# Patient Record
Sex: Female | Born: 1955 | Race: White | Hispanic: No | Marital: Married | Smoking: Never smoker
Health system: Southern US, Community
[De-identification: ages and names within clinical notes are randomized; demographics above are authoritative.]

## PROBLEM LIST (undated history)

## (undated) DIAGNOSIS — R011 Cardiac murmur, unspecified: Secondary | ICD-10-CM

## (undated) DIAGNOSIS — N751 Abscess of Bartholin's gland: Secondary | ICD-10-CM

## (undated) DIAGNOSIS — B029 Zoster without complications: Secondary | ICD-10-CM

## (undated) DIAGNOSIS — R002 Palpitations: Secondary | ICD-10-CM

## (undated) DIAGNOSIS — I493 Ventricular premature depolarization: Secondary | ICD-10-CM

## (undated) DIAGNOSIS — I341 Nonrheumatic mitral (valve) prolapse: Secondary | ICD-10-CM

## (undated) DIAGNOSIS — E785 Hyperlipidemia, unspecified: Secondary | ICD-10-CM

## (undated) DIAGNOSIS — IMO0002 Reserved for concepts with insufficient information to code with codable children: Secondary | ICD-10-CM

## (undated) HISTORY — DX: Palpitations: R00.2

## (undated) HISTORY — PX: TONSILLECTOMY AND ADENOIDECTOMY: SUR1326

## (undated) HISTORY — PX: KNEE ARTHROSCOPY: SUR90

## (undated) HISTORY — DX: Abscess of Bartholin's gland: N75.1

## (undated) HISTORY — PX: CERVICAL BIOPSY  W/ LOOP ELECTRODE EXCISION: SUR135

## (undated) HISTORY — DX: Reserved for concepts with insufficient information to code with codable children: IMO0002

## (undated) HISTORY — DX: Zoster without complications: B02.9

## (undated) HISTORY — DX: Cardiac murmur, unspecified: R01.1

## (undated) HISTORY — PX: COLPOSCOPY: SHX161

## (undated) HISTORY — DX: Hyperlipidemia, unspecified: E78.5

## (undated) HISTORY — PX: APPENDECTOMY: SHX54

## (undated) HISTORY — PX: AUGMENTATION MAMMAPLASTY: SUR837

## (undated) HISTORY — DX: Ventricular premature depolarization: I49.3

## (undated) HISTORY — DX: Nonrheumatic mitral (valve) prolapse: I34.1

---

## 1988-01-14 HISTORY — PX: AUGMENTATION MAMMAPLASTY: SUR837

## 1998-08-03 ENCOUNTER — Other Ambulatory Visit: Admission: RE | Admit: 1998-08-03 | Discharge: 1998-08-03 | Payer: Self-pay | Admitting: Obstetrics and Gynecology

## 1999-06-28 ENCOUNTER — Encounter: Admission: RE | Admit: 1999-06-28 | Discharge: 1999-06-28 | Payer: Self-pay | Admitting: Diagnostic Radiology

## 1999-06-28 ENCOUNTER — Encounter: Payer: Self-pay | Admitting: Diagnostic Radiology

## 1999-08-04 ENCOUNTER — Other Ambulatory Visit: Admission: RE | Admit: 1999-08-04 | Discharge: 1999-08-04 | Payer: Self-pay | Admitting: Obstetrics and Gynecology

## 2000-06-27 ENCOUNTER — Encounter: Payer: Self-pay | Admitting: Obstetrics and Gynecology

## 2000-06-27 ENCOUNTER — Encounter: Admission: RE | Admit: 2000-06-27 | Discharge: 2000-06-27 | Payer: Self-pay | Admitting: Obstetrics and Gynecology

## 2000-08-06 ENCOUNTER — Other Ambulatory Visit: Admission: RE | Admit: 2000-08-06 | Discharge: 2000-08-06 | Payer: Self-pay | Admitting: Obstetrics and Gynecology

## 2001-07-05 ENCOUNTER — Encounter: Payer: Self-pay | Admitting: Obstetrics and Gynecology

## 2001-07-05 ENCOUNTER — Encounter: Admission: RE | Admit: 2001-07-05 | Discharge: 2001-07-05 | Payer: Self-pay | Admitting: Obstetrics and Gynecology

## 2001-12-13 ENCOUNTER — Other Ambulatory Visit: Admission: RE | Admit: 2001-12-13 | Discharge: 2001-12-13 | Payer: Self-pay | Admitting: Obstetrics and Gynecology

## 2002-03-14 ENCOUNTER — Encounter: Payer: Self-pay | Admitting: *Deleted

## 2002-03-14 ENCOUNTER — Ambulatory Visit (HOSPITAL_COMMUNITY): Admission: RE | Admit: 2002-03-14 | Discharge: 2002-03-14 | Payer: Self-pay | Admitting: *Deleted

## 2002-07-07 ENCOUNTER — Encounter: Admission: RE | Admit: 2002-07-07 | Discharge: 2002-07-07 | Payer: Self-pay | Admitting: Obstetrics and Gynecology

## 2002-07-07 ENCOUNTER — Encounter: Payer: Self-pay | Admitting: Obstetrics and Gynecology

## 2003-02-26 ENCOUNTER — Encounter: Admission: RE | Admit: 2003-02-26 | Discharge: 2003-02-26 | Payer: Self-pay | Admitting: Diagnostic Radiology

## 2003-07-10 ENCOUNTER — Encounter: Admission: RE | Admit: 2003-07-10 | Discharge: 2003-07-10 | Payer: Self-pay | Admitting: Obstetrics and Gynecology

## 2003-07-29 ENCOUNTER — Other Ambulatory Visit: Admission: RE | Admit: 2003-07-29 | Discharge: 2003-07-29 | Payer: Self-pay | Admitting: Obstetrics and Gynecology

## 2003-08-17 ENCOUNTER — Ambulatory Visit (HOSPITAL_COMMUNITY): Admission: RE | Admit: 2003-08-17 | Discharge: 2003-08-17 | Payer: Self-pay | Admitting: Diagnostic Radiology

## 2003-08-20 ENCOUNTER — Encounter: Admission: RE | Admit: 2003-08-20 | Discharge: 2003-08-20 | Payer: Self-pay | Admitting: Neurosurgery

## 2004-07-19 ENCOUNTER — Encounter: Admission: RE | Admit: 2004-07-19 | Discharge: 2004-07-19 | Payer: Self-pay | Admitting: Obstetrics and Gynecology

## 2004-08-02 ENCOUNTER — Other Ambulatory Visit: Admission: RE | Admit: 2004-08-02 | Discharge: 2004-08-02 | Payer: Self-pay | Admitting: Obstetrics and Gynecology

## 2005-02-01 ENCOUNTER — Other Ambulatory Visit: Admission: RE | Admit: 2005-02-01 | Discharge: 2005-02-01 | Payer: Self-pay | Admitting: Obstetrics and Gynecology

## 2005-02-17 ENCOUNTER — Encounter: Admission: RE | Admit: 2005-02-17 | Discharge: 2005-02-17 | Payer: Self-pay | Admitting: Obstetrics and Gynecology

## 2005-05-18 ENCOUNTER — Other Ambulatory Visit: Admission: RE | Admit: 2005-05-18 | Discharge: 2005-05-18 | Payer: Self-pay | Admitting: Obstetrics and Gynecology

## 2005-07-31 ENCOUNTER — Encounter: Admission: RE | Admit: 2005-07-31 | Discharge: 2005-07-31 | Payer: Self-pay | Admitting: Obstetrics and Gynecology

## 2005-08-03 ENCOUNTER — Other Ambulatory Visit: Admission: RE | Admit: 2005-08-03 | Discharge: 2005-08-03 | Payer: Self-pay | Admitting: Obstetrics and Gynecology

## 2006-05-29 ENCOUNTER — Encounter: Admission: RE | Admit: 2006-05-29 | Discharge: 2006-05-29 | Payer: Self-pay | Admitting: Diagnostic Radiology

## 2006-08-07 ENCOUNTER — Other Ambulatory Visit: Admission: RE | Admit: 2006-08-07 | Discharge: 2006-08-07 | Payer: Self-pay | Admitting: Obstetrics and Gynecology

## 2006-08-27 ENCOUNTER — Encounter: Admission: RE | Admit: 2006-08-27 | Discharge: 2006-08-27 | Payer: Self-pay | Admitting: Obstetrics and Gynecology

## 2006-09-03 ENCOUNTER — Encounter: Admission: RE | Admit: 2006-09-03 | Discharge: 2006-09-03 | Payer: Self-pay | Admitting: Diagnostic Radiology

## 2006-09-03 HISTORY — PX: US ECHOCARDIOGRAPHY: HXRAD669

## 2006-10-15 ENCOUNTER — Emergency Department (HOSPITAL_COMMUNITY): Admission: EM | Admit: 2006-10-15 | Discharge: 2006-10-15 | Payer: Self-pay | Admitting: Emergency Medicine

## 2007-08-08 ENCOUNTER — Other Ambulatory Visit: Admission: RE | Admit: 2007-08-08 | Discharge: 2007-08-08 | Payer: Self-pay | Admitting: Obstetrics and Gynecology

## 2007-09-03 ENCOUNTER — Encounter: Admission: RE | Admit: 2007-09-03 | Discharge: 2007-09-03 | Payer: Self-pay | Admitting: Obstetrics and Gynecology

## 2008-02-20 ENCOUNTER — Encounter: Admission: RE | Admit: 2008-02-20 | Discharge: 2008-02-20 | Payer: Self-pay | Admitting: Diagnostic Radiology

## 2008-08-11 ENCOUNTER — Other Ambulatory Visit: Admission: RE | Admit: 2008-08-11 | Discharge: 2008-08-11 | Payer: Self-pay | Admitting: Obstetrics and Gynecology

## 2008-08-11 ENCOUNTER — Encounter: Payer: Self-pay | Admitting: Obstetrics and Gynecology

## 2008-08-11 ENCOUNTER — Ambulatory Visit: Payer: Self-pay | Admitting: Obstetrics and Gynecology

## 2008-09-08 ENCOUNTER — Encounter: Admission: RE | Admit: 2008-09-08 | Discharge: 2008-09-08 | Payer: Self-pay | Admitting: Obstetrics and Gynecology

## 2008-12-04 ENCOUNTER — Ambulatory Visit: Payer: Self-pay | Admitting: Obstetrics and Gynecology

## 2009-06-07 ENCOUNTER — Ambulatory Visit: Payer: Self-pay | Admitting: Obstetrics and Gynecology

## 2009-06-18 ENCOUNTER — Ambulatory Visit: Payer: Self-pay | Admitting: Obstetrics and Gynecology

## 2009-06-21 ENCOUNTER — Ambulatory Visit: Payer: Self-pay | Admitting: Obstetrics and Gynecology

## 2009-08-12 ENCOUNTER — Ambulatory Visit: Payer: Self-pay | Admitting: Obstetrics and Gynecology

## 2009-08-12 ENCOUNTER — Other Ambulatory Visit: Admission: RE | Admit: 2009-08-12 | Discharge: 2009-08-12 | Payer: Self-pay | Admitting: Obstetrics and Gynecology

## 2009-09-09 ENCOUNTER — Encounter: Admission: RE | Admit: 2009-09-09 | Discharge: 2009-09-09 | Payer: Self-pay | Admitting: Obstetrics and Gynecology

## 2009-11-01 ENCOUNTER — Encounter: Admission: RE | Admit: 2009-11-01 | Discharge: 2009-11-01 | Payer: Self-pay | Admitting: Otolaryngology

## 2010-01-10 ENCOUNTER — Ambulatory Visit: Payer: Self-pay | Admitting: Obstetrics and Gynecology

## 2010-08-16 ENCOUNTER — Encounter (INDEPENDENT_AMBULATORY_CARE_PROVIDER_SITE_OTHER): Payer: PRIVATE HEALTH INSURANCE | Admitting: Obstetrics and Gynecology

## 2010-08-16 ENCOUNTER — Other Ambulatory Visit (HOSPITAL_COMMUNITY)
Admission: RE | Admit: 2010-08-16 | Discharge: 2010-08-16 | Disposition: A | Discharge status: Home / Self Care | Payer: PRIVATE HEALTH INSURANCE | Source: Ambulatory Visit | Attending: Obstetrics and Gynecology | Admitting: Obstetrics and Gynecology

## 2010-08-16 ENCOUNTER — Other Ambulatory Visit: Payer: Self-pay | Admitting: Obstetrics and Gynecology

## 2010-08-16 DIAGNOSIS — Z01419 Encounter for gynecological examination (general) (routine) without abnormal findings: Secondary | ICD-10-CM

## 2010-08-16 DIAGNOSIS — Z124 Encounter for screening for malignant neoplasm of cervix: Secondary | ICD-10-CM | POA: Insufficient documentation

## 2010-08-17 ENCOUNTER — Other Ambulatory Visit: Payer: Self-pay | Admitting: Obstetrics and Gynecology

## 2010-08-17 DIAGNOSIS — Z1231 Encounter for screening mammogram for malignant neoplasm of breast: Secondary | ICD-10-CM

## 2010-09-27 ENCOUNTER — Ambulatory Visit
Admission: RE | Admit: 2010-09-27 | Discharge: 2010-09-27 | Disposition: A | Discharge status: Home / Self Care | Payer: PRIVATE HEALTH INSURANCE | Source: Ambulatory Visit | Attending: Obstetrics and Gynecology | Admitting: Obstetrics and Gynecology

## 2010-09-27 DIAGNOSIS — Z1231 Encounter for screening mammogram for malignant neoplasm of breast: Secondary | ICD-10-CM

## 2010-12-30 ENCOUNTER — Other Ambulatory Visit: Payer: PRIVATE HEALTH INSURANCE | Admitting: *Deleted

## 2011-01-06 ENCOUNTER — Ambulatory Visit: Payer: PRIVATE HEALTH INSURANCE | Admitting: Cardiology

## 2011-01-06 ENCOUNTER — Telehealth: Payer: Self-pay | Admitting: Cardiology

## 2011-01-06 NOTE — Telephone Encounter (Signed)
Left message for patient at home number and work number to reschedule 02/20/2011.

## 2011-01-09 ENCOUNTER — Other Ambulatory Visit: Payer: Self-pay | Admitting: *Deleted

## 2011-01-09 DIAGNOSIS — E7800 Pure hypercholesterolemia, unspecified: Secondary | ICD-10-CM

## 2011-02-04 ENCOUNTER — Encounter: Payer: Self-pay | Admitting: Cardiology

## 2011-02-07 ENCOUNTER — Other Ambulatory Visit: Payer: Self-pay | Admitting: Cardiology

## 2011-02-09 ENCOUNTER — Other Ambulatory Visit (INDEPENDENT_AMBULATORY_CARE_PROVIDER_SITE_OTHER): Payer: PRIVATE HEALTH INSURANCE | Admitting: *Deleted

## 2011-02-09 ENCOUNTER — Other Ambulatory Visit: Payer: Self-pay | Admitting: Cardiology

## 2011-02-09 DIAGNOSIS — E7800 Pure hypercholesterolemia, unspecified: Secondary | ICD-10-CM

## 2011-02-10 LAB — BASIC METABOLIC PANEL
BUN: 13 mg/dL (ref 6–23)
Calcium: 9.3 mg/dL (ref 8.4–10.5)
GFR: 72.3 mL/min (ref >60.00)
Glucose, Bld: 88 mg/dL (ref 70–99)
Sodium: 143 mEq/L (ref 135–145)

## 2011-02-10 LAB — LDL CHOLESTEROL, DIRECT: Direct LDL: 142.6 mg/dL

## 2011-02-10 LAB — LIPID PANEL
HDL: 76 mg/dL (ref >39.00)
Triglycerides: 37 mg/dL (ref 0.0–149.0)

## 2011-02-10 LAB — HEPATIC FUNCTION PANEL
Albumin: 3.9 g/dL (ref 3.5–5.2)
Total Bilirubin: 0.7 mg/dL (ref 0.3–1.2)

## 2011-02-20 ENCOUNTER — Ambulatory Visit: Payer: PRIVATE HEALTH INSURANCE | Admitting: Cardiology

## 2011-02-21 ENCOUNTER — Encounter: Payer: Self-pay | Admitting: Nurse Practitioner

## 2011-02-21 ENCOUNTER — Ambulatory Visit (INDEPENDENT_AMBULATORY_CARE_PROVIDER_SITE_OTHER): Payer: PRIVATE HEALTH INSURANCE | Admitting: Nurse Practitioner

## 2011-02-21 DIAGNOSIS — I059 Rheumatic mitral valve disease, unspecified: Secondary | ICD-10-CM

## 2011-02-21 DIAGNOSIS — I341 Nonrheumatic mitral (valve) prolapse: Secondary | ICD-10-CM | POA: Insufficient documentation

## 2011-02-21 DIAGNOSIS — R002 Palpitations: Secondary | ICD-10-CM

## 2011-02-21 DIAGNOSIS — E785 Hyperlipidemia, unspecified: Secondary | ICD-10-CM

## 2011-02-21 MED ORDER — NADOLOL 40 MG PO TABS: 40.0000 mg | ORAL_TABLET | Freq: Every day | ORAL | Status: DC

## 2011-02-21 NOTE — Progress Notes (Signed)
    Virginia Tate Date of Birth: Mar 21, 1956   History of Present Illness: Virginia Tate is seen today for her one year check. She is seen for Dr. Patty Sermons. She is doing great. No chest pain or shortness of breath. She rarely has palpitations. She continue to exercise and feels good. She needs a refill on her Corgard. Her labs are reviewed with her today as well.   Current Outpatient Prescriptions on File Prior to Visit  Medication Sig Dispense Refill  . aspirin 81 MG tablet Take 81 mg by mouth daily.        . Calcium Carbonate-Vitamin D (CALCIUM 600 + D PO) Take by mouth daily.        . cholecalciferol (VITAMIN D) 1000 UNITS tablet Take 1,000 Units by mouth daily.        . Coenzyme Q10 (COQ10 PO) Take by mouth.        . fish oil-omega-3 fatty acids 1000 MG capsule Take 1 g by mouth daily.        . Multiple Vitamin (MULTIVITAMIN) tablet Take 1 tablet by mouth daily.        . Multiple Vitamins-Minerals (EYE VITAMINS PO) Take by mouth daily.        Marland Kitchen DISCONTD: nadolol (CORGARD) 40 MG tablet TAKE ONE TABLET BY MOUTH ONE TIME DAILY  30 tablet  2    No Known Allergies  Past Medical History  Diagnosis Date  . MVP (mitral valve prolapse)     echo in 2008 with small MVP and trace MR  . Palpitations   . PVC's (premature ventricular contractions)   . Hyperlipidemia     has favorable LDL particle size and low insulin resistance score.    Past Surgical History  Procedure Date  . US echocardiography 09/03/2006    EF 55-60%    History  Smoking status  . Never Smoker   Smokeless tobacco  . Not on file    History  Alcohol Use No    Family History  Problem Relation Age of Onset  . Heart failure Father     Review of Systems: The review of systems is positive for rare palpitations.  All other systems were reviewed and are negative.  Physical Exam: BP 108/80  Pulse 54  Ht 5\' 7"  (1.702 m)  Wt 163 lb (73.936 kg)  BMI 25.53 kg/m2 Patient is very pleasant and in no acute distress.  Skin is warm and dry. Color is normal.  HEENT is unremarkable. Normocephalic/atraumatic. PERRL. Sclera are nonicteric. Neck is supple. No masses. No JVD. Lungs are clear. Cardiac exam shows a regular rate and rhythm. No murmur or click that I could appreciate.  Abdomen is soft. Extremities are without edema. Gait and ROM are intact. No gross neurologic deficits noted.   LABORATORY DATA:   Assessment / Plan:

## 2011-02-21 NOTE — Patient Instructions (Addendum)
I think you are doing well.  We will see you back in 12 months.  Call for any problems.

## 2011-02-21 NOTE — Assessment & Plan Note (Signed)
Last echo was in 2008. She continues to do very well. Will see her back in one year. Patient is agreeable to this plan and will call if any problems develop in the interim.

## 2011-02-21 NOTE — Assessment & Plan Note (Signed)
Her lipids are reviewed. She has had prior Lipomed showing favorable LDL particle size and low insulin resistance levels. She exercises regularly. She does take red yeast rice occasionally. No statin for now.

## 2011-02-21 NOTE — Assessment & Plan Note (Signed)
Corgard is refilled today for one year.

## 2011-05-10 ENCOUNTER — Other Ambulatory Visit: Payer: Self-pay | Admitting: *Deleted

## 2011-05-10 MED ORDER — NADOLOL 40 MG PO TABS: 40.0000 mg | ORAL_TABLET | Freq: Every day | ORAL | Status: DC

## 2011-08-17 ENCOUNTER — Encounter: Payer: Self-pay | Admitting: Gynecology

## 2011-08-17 DIAGNOSIS — IMO0002 Reserved for concepts with insufficient information to code with codable children: Secondary | ICD-10-CM | POA: Insufficient documentation

## 2011-08-17 DIAGNOSIS — N751 Abscess of Bartholin's gland: Secondary | ICD-10-CM | POA: Insufficient documentation

## 2011-08-24 ENCOUNTER — Other Ambulatory Visit: Payer: Self-pay | Admitting: Obstetrics and Gynecology

## 2011-08-24 ENCOUNTER — Ambulatory Visit (INDEPENDENT_AMBULATORY_CARE_PROVIDER_SITE_OTHER): Payer: PRIVATE HEALTH INSURANCE | Admitting: Obstetrics and Gynecology

## 2011-08-24 ENCOUNTER — Other Ambulatory Visit (HOSPITAL_COMMUNITY)
Admission: RE | Admit: 2011-08-24 | Discharge: 2011-08-24 | Disposition: A | Discharge status: Home / Self Care | Payer: PRIVATE HEALTH INSURANCE | Source: Ambulatory Visit | Attending: Obstetrics and Gynecology | Admitting: Obstetrics and Gynecology

## 2011-08-24 ENCOUNTER — Encounter: Payer: Self-pay | Admitting: Obstetrics and Gynecology

## 2011-08-24 VITALS — BP 120/76 | Ht 66.0 in | Wt 162.0 lb

## 2011-08-24 DIAGNOSIS — Z01419 Encounter for gynecological examination (general) (routine) without abnormal findings: Secondary | ICD-10-CM

## 2011-08-24 DIAGNOSIS — Z1231 Encounter for screening mammogram for malignant neoplasm of breast: Secondary | ICD-10-CM

## 2011-08-24 MED ORDER — ESTRADIOL 2 MG VA RING: 2.0000 mg | VAGINAL_RING | VAGINAL | Status: DC

## 2011-08-24 NOTE — Progress Notes (Signed)
Patient came to see me today for her annual GYN exam. She does very well with an Estring. She has no systemic menopausal symptoms. She is up-to-date on her mammograms. She has had 3 normal bone densities. She is having no vaginal bleeding. She is having no pelvic pain. She had a LEEP for cervical dysplasia in 2006 and has had normal Paps since that. She does her lab through her PCP.  Physical examination: HEENT within normal limits. Neck: Thyroid not large. No masses. Supraclavicular nodes: not enlarged. Breasts: Examined in both sitting and lying  position. No skin changes and no masses. Abdomen: Soft no guarding rebound or masses or hernia. Pelvic: External: Within normal limits. BUS: Within normal limits. Vaginal:within normal limits. Good estrogen effect. No evidence of cystocele rectocele or enterocele. Cervix: clean. Uterus: Normal size and shape. Adnexa: No masses. Rectovaginal exam: Confirmatory and negative. Extremities: Within normal limits.  Assessment: Atrophic vaginitis. LGSIL.  Plan: Continue yearly mammograms. Continue Estring.

## 2011-08-25 LAB — URINALYSIS W MICROSCOPIC + REFLEX CULTURE
Casts: NONE SEEN
Crystals: NONE SEEN
Leukocytes, UA: NEGATIVE
Nitrite: NEGATIVE
Specific Gravity, Urine: <1.005 — ABNORMAL LOW (ref 1.005–1.030)
Urobilinogen, UA: 0.2 mg/dL (ref 0.0–1.0)
pH: 7.5 (ref 5.0–8.0)

## 2011-09-28 ENCOUNTER — Ambulatory Visit
Admission: RE | Admit: 2011-09-28 | Discharge: 2011-09-28 | Disposition: A | Discharge status: Home / Self Care | Payer: PRIVATE HEALTH INSURANCE | Source: Ambulatory Visit | Attending: Obstetrics and Gynecology | Admitting: Obstetrics and Gynecology

## 2011-09-28 DIAGNOSIS — Z1231 Encounter for screening mammogram for malignant neoplasm of breast: Secondary | ICD-10-CM

## 2011-11-13 ENCOUNTER — Other Ambulatory Visit: Payer: Self-pay | Admitting: Dermatology

## 2012-02-27 ENCOUNTER — Ambulatory Visit (INDEPENDENT_AMBULATORY_CARE_PROVIDER_SITE_OTHER): Payer: PRIVATE HEALTH INSURANCE | Admitting: Cardiology

## 2012-02-27 ENCOUNTER — Encounter: Payer: Self-pay | Admitting: Cardiology

## 2012-02-27 VITALS — BP 110/70 | HR 70 | Resp 18 | Ht 66.0 in | Wt 163.4 lb

## 2012-02-27 DIAGNOSIS — I341 Nonrheumatic mitral (valve) prolapse: Secondary | ICD-10-CM

## 2012-02-27 DIAGNOSIS — I059 Rheumatic mitral valve disease, unspecified: Secondary | ICD-10-CM

## 2012-02-27 DIAGNOSIS — E7800 Pure hypercholesterolemia, unspecified: Secondary | ICD-10-CM

## 2012-02-27 DIAGNOSIS — R002 Palpitations: Secondary | ICD-10-CM

## 2012-02-27 DIAGNOSIS — E785 Hyperlipidemia, unspecified: Secondary | ICD-10-CM

## 2012-02-27 MED ORDER — NADOLOL 40 MG PO TABS: 40.0000 mg | ORAL_TABLET | Freq: Every day | ORAL | Status: DC

## 2012-02-27 NOTE — Patient Instructions (Addendum)
Will have you return soon for labs (tomorrow), anytime after 8:30 am  Your physician recommends that you continue on your current medications as directed. Please refer to the Current Medication list given to you today.  Your physician wants you to follow-up in: 1 year  You will receive a reminder letter in the mail two months in advance. If you don't receive a letter, please call our office to schedule the follow-up appointment.

## 2012-02-27 NOTE — Assessment & Plan Note (Signed)
Previously her lipids are followed by her gynecologist.  However he will be retiring in June and she has asked [place] to start monitoring her lipids.  She did not come fasting today.  She will return soon for fasting lipid panel.

## 2012-02-27 NOTE — Assessment & Plan Note (Signed)
Patient is having very infrequent palpitations.  When they do occur it is usually when she first lies down at night.  Is not having chest pain shortness of breath dizziness or syncope.  She stays physically active working out and playing golf.  She no longer plays tennis.  She remains on nadolol 40 mg daily which she has tolerated well.

## 2012-02-27 NOTE — Progress Notes (Signed)
Virginia Tate Date of Birth:  Mar 26, 1956 [facility] [address] [place], [place]  [address] [phone]  Fax   [phone]  HPI: This pleasant 56 year old woman is seen for a one-year followup office visit.  As a past history of mild mitral valve prolapse.  An echocardiogram in 2008 which showed a small degree of mitral valve prolapse and trace mitral regurgitation.  She has a past history of PVCs which have improved with avoidance of caffeine with regular aerobic exercise.  She has a past history of hyperlipidemia but because of favorable LDL particle size and low insulin resistance scorer she has not had to go on any statin therapy.  Since last visit she has been doing well.    Current Outpatient Prescriptions  Medication Sig Dispense Refill  . aspirin 81 MG tablet Take 81 mg by mouth daily.        . Calcium Carbonate-Vitamin D (CALCIUM 600 + D PO) Take by mouth daily.        . cholecalciferol (VITAMIN D) 1000 UNITS tablet Take 1,000 Units by mouth daily.        . Coenzyme Q10 (COQ10 PO) Take by mouth.        . estradiol (ESTRING) 2 MG vaginal ring Place 2 mg vaginally every 3 (three) months. follow package directions  1 each  4  . fish oil-omega-3 fatty acids 1000 MG capsule Take 1 g by mouth daily.        . Multiple Vitamin (MULTIVITAMIN) tablet Take 1 tablet by mouth daily.        . Multiple Vitamins-Minerals (EYE VITAMINS PO) Take by mouth daily.        . nadolol (CORGARD) 40 MG tablet Take 1 tablet (40 mg total) by mouth daily.  90 tablet  3  . DISCONTD: nadolol (CORGARD) 40 MG tablet Take 1 tablet (40 mg total) by mouth daily.  90 tablet  3    No Known Allergies  Patient Active Problem List  Diagnosis  . MVP (mitral valve prolapse)  . Hyperlipidemia  . Palpitation  . LGSIL (low grade squamous intraepithelial dysplasia)  . Bartholin's gland abscess    History  Smoking status  . Never Smoker   Smokeless tobacco  . Not on file    History    Alcohol Use  . 1.0 oz/week  . 2 drink(s) per week    Family History  Problem Relation Age of Onset  . Heart failure Father   . Hypertension Father   . Hypertension Mother   . Diabetes Sister   . Breast cancer Sister     Age 36  . Hypertension Brother   . Breast cancer Paternal Grandmother     Age 66's    Review of Systems: The patient denies any heat or cold intolerance.  No weight gain or weight loss.  The patient denies headaches or blurry vision.  There is no cough or sputum production.  The patient denies dizziness.  There is no hematuria or hematochezia.  The patient denies any muscle aches or arthritis.  The patient denies any rash.  The patient denies frequent falling or instability.  There is no history of depression or anxiety.  All other systems were reviewed and are negative.   Physical Exam: Filed Vitals:   02/27/12 1415  BP: 110/70  Pulse: 70  Resp: 18   the general appearance reveals a well-developed well-nourished woman in no distress.The head and neck exam reveals pupils equal and  reactive.  Extraocular movements are full.  There is no scleral icterus.  The mouth and pharynx are normal.  The neck is supple.  The carotids reveal no bruits.  The jugular venous pressure is normal.  The  thyroid is not enlarged.  There is no lymphadenopathy.  The chest is clear to percussion and auscultation.  There are no rales or rhonchi.  Expansion of the chest is symmetrical.  The precordium is quiet.  The first heart sound is normal.  The second heart sound is physiologically split.  There is no murmur gallop rub or click.  Murmur and click are not heard today. There is no abnormal lift or heave.  The abdomen is soft and nontender.  The bowel sounds are normal.  The liver and spleen are not enlarged.  There are no abdominal masses.  There are no abdominal bruits.  Extremities reveal good pedal pulses.  There is no phlebitis or edema.  There is no cyanosis or clubbing.  Strength is  normal and symmetrical in all extremities.  There is no lateralizing weakness.  There are no sensory deficits.  The skin is warm and dry.  There is no rash.      Assessment / Plan: Return soon for lipid panel.  Recheck in one year for office visit EKG and lipid panel.

## 2012-02-28 ENCOUNTER — Other Ambulatory Visit: Payer: PRIVATE HEALTH INSURANCE

## 2012-02-29 ENCOUNTER — Other Ambulatory Visit (INDEPENDENT_AMBULATORY_CARE_PROVIDER_SITE_OTHER): Payer: PRIVATE HEALTH INSURANCE

## 2012-02-29 DIAGNOSIS — E7800 Pure hypercholesterolemia, unspecified: Secondary | ICD-10-CM

## 2012-02-29 LAB — LIPID PANEL: VLDL: 7.8 mg/dL (ref 0.0–40.0)

## 2012-03-05 ENCOUNTER — Telehealth: Payer: Self-pay | Admitting: Cardiology

## 2012-03-05 NOTE — Telephone Encounter (Signed)
New Problem:     Patient called in wanting to let Dr. Patty Sermons know to call her on her cell phone the rest of this week to let her know the rest of her lab results.  Please call back if you have any questions.

## 2012-03-05 NOTE — Telephone Encounter (Signed)
Discussed with  Dr. Patty Sermons this morning and he will call patient back tomorrow when he is back in the office

## 2012-03-07 ENCOUNTER — Telehealth: Payer: Self-pay | Admitting: *Deleted

## 2012-03-07 NOTE — Telephone Encounter (Signed)
Message copied by Burnell Blanks on Thu Mar 07, 2012  2:42 PM ------      Message from: Cassell Clement      Created: Wed Mar 06, 2012  2:08 PM       I spoke with Hassell Done on the phone.  We reviewed her labs.  Prior to these labs she had just been in [place] for 2 weeks and had been offered good diet.  We talked about pros and cons of statin therapy.  We agreed not to start any statin therapy at this point but in 4 months she will call back and we want her to have both a regular lipid panel and also a [employer] lipid analysis to look at the sub-fractions of LDL.  She wondered if in the meantime she should go back on red yeast rice which she has taken in the past and there would be no harm in her doing that now.

## 2012-03-07 NOTE — Telephone Encounter (Signed)
Patient advised by  Dr. Patty Sermons

## 2012-03-18 ENCOUNTER — Encounter: Payer: Self-pay | Admitting: Cardiology

## 2012-06-29 ENCOUNTER — Other Ambulatory Visit: Payer: Self-pay

## 2012-08-29 ENCOUNTER — Other Ambulatory Visit: Payer: Self-pay

## 2012-08-29 ENCOUNTER — Other Ambulatory Visit: Payer: Self-pay | Admitting: Obstetrics and Gynecology

## 2012-08-29 DIAGNOSIS — Z1231 Encounter for screening mammogram for malignant neoplasm of breast: Secondary | ICD-10-CM

## 2012-09-12 ENCOUNTER — Encounter: Payer: Self-pay | Admitting: Women's Health

## 2012-09-26 ENCOUNTER — Other Ambulatory Visit (HOSPITAL_COMMUNITY)
Admission: RE | Admit: 2012-09-26 | Discharge: 2012-09-26 | Disposition: A | Discharge status: Home / Self Care | Payer: PRIVATE HEALTH INSURANCE | Source: Ambulatory Visit | Attending: Obstetrics and Gynecology | Admitting: Obstetrics and Gynecology

## 2012-09-26 ENCOUNTER — Ambulatory Visit (INDEPENDENT_AMBULATORY_CARE_PROVIDER_SITE_OTHER): Payer: PRIVATE HEALTH INSURANCE | Admitting: Women's Health

## 2012-09-26 ENCOUNTER — Encounter: Payer: Self-pay | Admitting: Women's Health

## 2012-09-26 VITALS — BP 116/70 | Ht 66.5 in | Wt 163.0 lb

## 2012-09-26 DIAGNOSIS — Z79890 Hormone replacement therapy (postmenopausal): Secondary | ICD-10-CM

## 2012-09-26 DIAGNOSIS — Z01419 Encounter for gynecological examination (general) (routine) without abnormal findings: Secondary | ICD-10-CM | POA: Insufficient documentation

## 2012-09-26 MED ORDER — ESTRADIOL 2 MG VA RING: 2.0000 mg | VAGINAL_RING | VAGINAL | Status: DC

## 2012-09-26 NOTE — Patient Instructions (Signed)

## 2012-09-26 NOTE — Progress Notes (Signed)
Virginia Tate Jun 11, 1955 [id number]    History:    The patient presents for annual exam.  Postmenopausal using estring for vaginal dryness with good relief. No bleeding. History of PVCs/palpitations/MV P. Cardiologist manages medications and labs. History of LGSIL-LEEP in 2006 with normal Paps following. Normal mammograms and DEXAs. DEXA 2011 T score 0.8 at spine, -0.6 left hip. Normal colonoscopy 2013, virtual colonoscopy prior normal.   Past medical history, past surgical history, family history and social history were all reviewed and documented in the EPIC chart. 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 Husband radiologist. Sister with breast cancer age 7, also diabetes. Parents - Hypertension. Appendectomy. Twins sons, 1 lives in [place], 1 in [place]. Daughter lives in [place].   ROS:  A  ROS was performed and pertinent positives and negatives are included in the history.  Exam:  Filed Vitals:   09/26/12 0912  BP: 116/70    General appearance:  Normal Head/Neck:  Normal, without cervical or supraclavicular adenopathy. Thyroid:  Symmetrical, normal in size, without palpable masses or nodularity. Respiratory  Effort:  Normal  Auscultation:  Clear without wheezing or rhonchi Cardiovascular  Auscultation:  Regular rate, without rubs, murmurs or gallops  Edema/varicosities:  Not grossly evident Abdominal  Soft,nontender, without masses, guarding or rebound.  Liver/spleen:  No organomegaly noted  Hernia:  None appreciated  Skin  Inspection:  Grossly normal  Palpation:  Grossly normal Neurologic/psychiatric  Orientation:  Normal with appropriate conversation.  Mood/affect:  Normal  Genitourinary    Breasts: Examined lying and sitting/silicone implants.     Right: Without masses, retractions, discharge or axillary adenopathy.     Left: Without masses, retractions, discharge or axillary adenopathy.   Inguinal/mons:  Normal without inguinal adenopathy  External  genitalia:  Normal  BUS/Urethra/Skene's glands:  Normal  Bladder:  Normal  Vagina:  Normal  Cervix:  Normal  Uterus:  normal in size, shape and contour.  Midline and mobile  Adnexa/parametria:     Rt: Without masses or tenderness.   Lt: Without masses or tenderness.  Anus and perineum: Normal  Digital rectal exam: Normal sphincter tone without palpated masses or tenderness  Assessment/Plan:  57 y.o. MWF G2P3 for annual exam with no complaints.  LGSIL/LEEP 2006 normal Paps following Estring good relief vaginal dryness Normal DEXA PVCs/palpitations/MVP-Corgard and labs cardiologist  Plan: Estring 2 mg per vagina every 3 months, prescription, proper use given and reviewed. SBE's, continue annual mammogram, continue regular exercise, working with a trainer, calcium rich foods, vitamin D 2000 daily. Pap. Repeat DEXA next year.     Harrington Challenger WHNP, 12:08 PM 09/26/2012

## 2012-09-30 ENCOUNTER — Encounter: Payer: Self-pay | Admitting: Obstetrics and Gynecology

## 2012-10-03 ENCOUNTER — Ambulatory Visit
Admission: RE | Admit: 2012-10-03 | Discharge: 2012-10-03 | Disposition: A | Discharge status: Home / Self Care | Payer: PRIVATE HEALTH INSURANCE | Source: Ambulatory Visit

## 2012-10-03 DIAGNOSIS — Z1231 Encounter for screening mammogram for malignant neoplasm of breast: Secondary | ICD-10-CM

## 2013-02-20 ENCOUNTER — Telehealth: Payer: Self-pay | Admitting: Cardiology

## 2013-02-20 DIAGNOSIS — E785 Hyperlipidemia, unspecified: Secondary | ICD-10-CM

## 2013-02-20 NOTE — Telephone Encounter (Signed)
Scheduled labs, orders in, and advised patient.

## 2013-02-20 NOTE — Telephone Encounter (Signed)
New message   Want order and appt for full lipid profile before she see Dr Patty Sermons for annual.

## 2013-02-24 ENCOUNTER — Other Ambulatory Visit: Payer: Self-pay | Admitting: Cardiology

## 2013-02-27 ENCOUNTER — Other Ambulatory Visit: Payer: PRIVATE HEALTH INSURANCE

## 2013-02-28 ENCOUNTER — Other Ambulatory Visit (INDEPENDENT_AMBULATORY_CARE_PROVIDER_SITE_OTHER): Payer: PRIVATE HEALTH INSURANCE

## 2013-02-28 DIAGNOSIS — E785 Hyperlipidemia, unspecified: Secondary | ICD-10-CM

## 2013-02-28 LAB — LIPID PANEL
Cholesterol: 247 mg/dL — ABNORMAL HIGH (ref 0–200)
HDL: 74 mg/dL (ref >39.00)
Total CHOL/HDL Ratio: 3
Triglycerides: 49 mg/dL (ref 0.0–149.0)

## 2013-02-28 LAB — BASIC METABOLIC PANEL
CO2: 27 mEq/L (ref 19–32)
Calcium: 9.2 mg/dL (ref 8.4–10.5)
GFR: 69 mL/min (ref >60.00)
Sodium: 140 mEq/L (ref 135–145)

## 2013-02-28 LAB — HEPATIC FUNCTION PANEL
ALT: 19 U/L (ref 0–35)
AST: 19 U/L (ref 0–37)
Albumin: 3.9 g/dL (ref 3.5–5.2)

## 2013-02-28 NOTE — Progress Notes (Signed)
Quick Note:  Please make copy of labs for patient visit. ______ 

## 2013-03-04 ENCOUNTER — Ambulatory Visit (INDEPENDENT_AMBULATORY_CARE_PROVIDER_SITE_OTHER): Payer: PRIVATE HEALTH INSURANCE | Admitting: Cardiology

## 2013-03-04 ENCOUNTER — Encounter: Payer: Self-pay | Admitting: Cardiology

## 2013-03-04 VITALS — BP 123/72 | HR 59 | Ht 67.0 in | Wt 167.0 lb

## 2013-03-04 DIAGNOSIS — I059 Rheumatic mitral valve disease, unspecified: Secondary | ICD-10-CM

## 2013-03-04 DIAGNOSIS — E785 Hyperlipidemia, unspecified: Secondary | ICD-10-CM

## 2013-03-04 DIAGNOSIS — I493 Ventricular premature depolarization: Secondary | ICD-10-CM

## 2013-03-04 DIAGNOSIS — I341 Nonrheumatic mitral (valve) prolapse: Secondary | ICD-10-CM

## 2013-03-04 DIAGNOSIS — E7800 Pure hypercholesterolemia, unspecified: Secondary | ICD-10-CM

## 2013-03-04 DIAGNOSIS — R002 Palpitations: Secondary | ICD-10-CM

## 2013-03-04 DIAGNOSIS — I4949 Other premature depolarization: Secondary | ICD-10-CM

## 2013-03-04 NOTE — Assessment & Plan Note (Signed)
She has not been aware of any palpitations or tachycardia.  EKG today shows a single interpolated PVC

## 2013-03-04 NOTE — Assessment & Plan Note (Signed)
We reviewed her lipids which show improvement in LDL and HDL levels since last year.  We are going to have her return soon for a an MR lack of sense like a profile to look at particle size and insulin resistance.  At this point it does not appear that she will need to go on statin therapy.

## 2013-03-04 NOTE — Assessment & Plan Note (Signed)
No chest pain.  No shortness of breath.  Exercise tolerance is good.

## 2013-03-04 NOTE — Patient Instructions (Addendum)
Return soon for NMR fasting  Your physician recommends that you continue on your current medications as directed. Please refer to the Current Medication list given to you today.  Your physician wants you to follow-up in: 1 year You will receive a reminder letter in the mail two months in advance. If you don't receive a letter, please call our office to schedule the follow-up appointment.

## 2013-03-04 NOTE — Progress Notes (Signed)
Virginia Tate Date of Birth:  02-14-1956 [address] [place], [place]  [address] [phone]  Fax   [phone]  HPI: This pleasant 57 year old woman is seen for a one-year followup office visit.  She has  a past history of mild mitral valve prolapse.  An echocardiogram in 2008 which showed a small degree of mitral valve prolapse and trace mitral regurgitation.  She has a past history of PVCs which have improved with avoidance of caffeine with regular aerobic exercise.  She remains on nadolol 40 mg daily.  She has a past history of hyperlipidemia but because of favorable LDL particle size and low insulin resistance score she has not had to go on any statin therapy.  Since last visit she has been doing well.  She continues to exercise regularly.  She works out with a Psychologist, educational several times a week and also does yoga.  Current Outpatient Prescriptions  Medication Sig Dispense Refill  . aspirin 81 MG tablet Take 81 mg by mouth daily.        . Calcium Carbonate-Vitamin D (CALCIUM 600 + D PO) Take by mouth daily.        . cholecalciferol (VITAMIN D) 1000 UNITS tablet Take 1,000 Units by mouth daily.        . Coenzyme Q10 (COQ10 PO) Take by mouth.        . estradiol (ESTRING) 2 MG vaginal ring Place 2 mg vaginally every 3 (three) months. follow package directions  1 each  4  . fish oil-omega-3 fatty acids 1000 MG capsule Take 1 g by mouth daily.        . Multiple Vitamin (MULTIVITAMIN) tablet Take 1 tablet by mouth daily.        . nadolol (CORGARD) 40 MG tablet Take one tablet by mouth one time daily  30 tablet  0   No current facility-administered medications for this visit.    No Known Allergies  Patient Active Problem List   Diagnosis Date Noted  . LGSIL (low grade squamous intraepithelial dysplasia)   . MVP (mitral valve prolapse) 02/21/2011  . Hyperlipidemia 02/21/2011  . Palpitation 02/21/2011    History  Smoking status  . Never Smoker   Smokeless tobacco    . Not on file    History  Alcohol Use  . 1.0 oz/week  . 2 drink(s) per week    Family History  Problem Relation Age of Onset  . Heart failure Father   . Hypertension Father   . Hypertension Mother   . Diabetes Sister   . Breast cancer Sister     Age 79  . Hypertension Brother   . Breast cancer Paternal Grandmother     Age 57's    Review of Systems: The patient denies any heat or cold intolerance.  No weight gain or weight loss.  The patient denies headaches or blurry vision.  There is no cough or sputum production.  The patient denies dizziness.  There is no hematuria or hematochezia.  The patient denies any muscle aches or arthritis.  The patient denies any rash.  The patient denies frequent falling or instability.  There is no history of depression or anxiety.  All other systems were reviewed and are negative.   Physical Exam: Filed Vitals:   03/04/13 1603  BP: 123/72  Pulse: 59   the general appearance reveals a well-developed well-nourished woman in no distress.The head and neck exam reveals pupils equal and reactive.  Extraocular  movements are full.  There is no scleral icterus.  The mouth and pharynx are normal.  The neck is supple.  The carotids reveal no bruits.  The jugular venous pressure is normal.  The  thyroid is not enlarged.  There is no lymphadenopathy.  The chest is clear to percussion and auscultation.  There are no rales or rhonchi.  Expansion of the chest is symmetrical.  The precordium is quiet.  The first heart sound is normal.  The second heart sound is physiologically split.  There is no murmur gallop rub or click.  Murmur and click are not heard today. There is no abnormal lift or heave.  The abdomen is soft and nontender.  The bowel sounds are normal.  The liver and spleen are not enlarged.  There are no abdominal masses.  There are no abdominal bruits.  Extremities reveal good pedal pulses.  There is no phlebitis or edema.  There is no cyanosis or clubbing.   Strength is normal and symmetrical in all extremities.  There is no lateralizing weakness.  There are no sensory deficits.  The skin is warm and dry.  There is no rash.   EKG today shows normal sinus rhythm with a single PVC.  No ischemic changes  Assessment / Plan: Return soon for NMR lipoprofile. Continue same medication.  Return in one year for followup office visit and lipid panel and NMR LipoProfile

## 2013-03-07 ENCOUNTER — Other Ambulatory Visit: Payer: PRIVATE HEALTH INSURANCE

## 2013-03-07 DIAGNOSIS — E7800 Pure hypercholesterolemia, unspecified: Secondary | ICD-10-CM

## 2013-03-10 LAB — NMR LIPOPROFILE WITHOUT LIPIDS
HDL Particle Number: 39.7 umol/L (ref >=30.5)
LDL Particle Number: 1785 nmol/L — ABNORMAL HIGH (ref <1000)
LP-IR Score: <25 (ref <=45)

## 2013-03-20 ENCOUNTER — Other Ambulatory Visit: Payer: Self-pay

## 2013-04-30 ENCOUNTER — Other Ambulatory Visit: Payer: Self-pay | Admitting: Cardiology

## 2013-09-10 ENCOUNTER — Other Ambulatory Visit: Payer: Self-pay

## 2013-09-10 DIAGNOSIS — Z1231 Encounter for screening mammogram for malignant neoplasm of breast: Secondary | ICD-10-CM

## 2013-10-04 ENCOUNTER — Other Ambulatory Visit: Payer: Self-pay | Admitting: Women's Health

## 2013-10-07 ENCOUNTER — Ambulatory Visit
Admission: RE | Admit: 2013-10-07 | Discharge: 2013-10-07 | Disposition: A | Discharge status: Home / Self Care | Payer: PRIVATE HEALTH INSURANCE | Source: Ambulatory Visit

## 2013-10-07 DIAGNOSIS — Z1231 Encounter for screening mammogram for malignant neoplasm of breast: Secondary | ICD-10-CM

## 2013-10-23 ENCOUNTER — Other Ambulatory Visit (HOSPITAL_COMMUNITY)
Admission: RE | Admit: 2013-10-23 | Discharge: 2013-10-23 | Disposition: A | Discharge status: Home / Self Care | Payer: PRIVATE HEALTH INSURANCE | Source: Ambulatory Visit | Attending: Internal Medicine | Admitting: Internal Medicine

## 2013-10-23 ENCOUNTER — Other Ambulatory Visit: Payer: Self-pay | Admitting: Internal Medicine

## 2013-10-23 DIAGNOSIS — R87810 Cervical high risk human papillomavirus (HPV) DNA test positive: Secondary | ICD-10-CM | POA: Insufficient documentation

## 2013-10-23 DIAGNOSIS — Z1151 Encounter for screening for human papillomavirus (HPV): Secondary | ICD-10-CM | POA: Insufficient documentation

## 2013-10-23 DIAGNOSIS — Z01419 Encounter for gynecological examination (general) (routine) without abnormal findings: Secondary | ICD-10-CM | POA: Insufficient documentation

## 2013-10-27 LAB — CYTOLOGY - PAP

## 2013-11-16 ENCOUNTER — Other Ambulatory Visit: Payer: Self-pay | Admitting: Cardiology

## 2013-11-24 ENCOUNTER — Other Ambulatory Visit: Payer: Self-pay | Admitting: Dermatology

## 2013-12-28 ENCOUNTER — Other Ambulatory Visit: Payer: Self-pay | Admitting: Women's Health

## 2014-01-07 ENCOUNTER — Other Ambulatory Visit: Payer: Self-pay | Admitting: Women's Health

## 2014-02-12 ENCOUNTER — Ambulatory Visit: Payer: PRIVATE HEALTH INSURANCE | Admitting: Cardiology

## 2014-02-20 ENCOUNTER — Other Ambulatory Visit (INDEPENDENT_AMBULATORY_CARE_PROVIDER_SITE_OTHER): Payer: PRIVATE HEALTH INSURANCE | Admitting: *Deleted

## 2014-02-20 ENCOUNTER — Other Ambulatory Visit (INDEPENDENT_AMBULATORY_CARE_PROVIDER_SITE_OTHER): Payer: PRIVATE HEALTH INSURANCE

## 2014-02-20 DIAGNOSIS — E7800 Pure hypercholesterolemia, unspecified: Secondary | ICD-10-CM

## 2014-02-20 DIAGNOSIS — E784 Other hyperlipidemia: Secondary | ICD-10-CM

## 2014-02-20 DIAGNOSIS — E785 Hyperlipidemia, unspecified: Secondary | ICD-10-CM

## 2014-02-20 LAB — BASIC METABOLIC PANEL
BUN: 15 mg/dL (ref 6–23)
CHLORIDE: 108 meq/L (ref 96–112)
CO2: 25 meq/L (ref 19–32)
CREATININE: 0.8 mg/dL (ref 0.4–1.2)
Calcium: 9.4 mg/dL (ref 8.4–10.5)
GFR: 78.91 mL/min (ref >60.00)
Glucose, Bld: 81 mg/dL (ref 70–99)
Potassium: 4 mEq/L (ref 3.5–5.1)
SODIUM: 138 meq/L (ref 135–145)

## 2014-02-20 LAB — HEPATIC FUNCTION PANEL
ALBUMIN: 3.4 g/dL — AB (ref 3.5–5.2)
ALK PHOS: 53 U/L (ref 39–117)
ALT: 17 U/L (ref 0–35)
AST: 17 U/L (ref 0–37)
Bilirubin, Direct: 0 mg/dL (ref 0.0–0.3)
TOTAL PROTEIN: 6.7 g/dL (ref 6.0–8.3)
Total Bilirubin: 0.8 mg/dL (ref 0.2–1.2)

## 2014-02-20 LAB — LIPID PANEL
CHOL/HDL RATIO: 4
CHOLESTEROL: 239 mg/dL — AB (ref 0–200)
HDL: 62.7 mg/dL (ref >39.00)
LDL CALC: 165 mg/dL — AB (ref 0–99)
NonHDL: 176.3
TRIGLYCERIDES: 59 mg/dL (ref 0.0–149.0)
VLDL: 11.8 mg/dL (ref 0.0–40.0)

## 2014-02-20 NOTE — Progress Notes (Signed)
Quick Note:  Please make copy of labs for patient visit. ______ 

## 2014-02-22 LAB — NMR LIPOPROFILE WITHOUT LIPIDS
HDL Particle Number: 33.7 umol/L (ref >=30.5)
HDL SIZE: 9.4 nm (ref >=9.2)
LDL Particle Number: 1788 nmol/L — ABNORMAL HIGH (ref <1000)
LDL SIZE: 21.6 nm (ref >=20.8)
Large HDL-P: 10.1 umol/L (ref >=4.8)
SMALL LDL PARTICLE NUMBER: 455 nmol/L (ref <=527)

## 2014-02-22 NOTE — Progress Notes (Signed)
Quick Note:  Please make copy of labs for patient visit. ______ 

## 2014-02-23 ENCOUNTER — Other Ambulatory Visit: Payer: PRIVATE HEALTH INSURANCE

## 2014-03-02 ENCOUNTER — Encounter: Payer: Self-pay | Admitting: Cardiology

## 2014-03-02 ENCOUNTER — Ambulatory Visit (INDEPENDENT_AMBULATORY_CARE_PROVIDER_SITE_OTHER): Payer: PRIVATE HEALTH INSURANCE | Admitting: Cardiology

## 2014-03-02 VITALS — BP 112/76 | HR 50 | Ht 67.0 in | Wt 154.0 lb

## 2014-03-02 DIAGNOSIS — I341 Nonrheumatic mitral (valve) prolapse: Secondary | ICD-10-CM

## 2014-03-02 DIAGNOSIS — E7800 Pure hypercholesterolemia, unspecified: Secondary | ICD-10-CM

## 2014-03-02 DIAGNOSIS — E785 Hyperlipidemia, unspecified: Secondary | ICD-10-CM

## 2014-03-02 DIAGNOSIS — I493 Ventricular premature depolarization: Secondary | ICD-10-CM

## 2014-03-02 MED ORDER — NADOLOL 40 MG PO TABS: ORAL_TABLET | ORAL | Status: DC

## 2014-03-02 NOTE — Assessment & Plan Note (Signed)
The patient has a history of hyperlipidemia on a genetic basis.  We did another  NMR LipoProfile which we reviewed together today.  It shows a high number of LDL particles but a normal number of small LDL particles.  She has a very low level of insulin resistance.  Her HDL is favorable.  We plugged her numbers into the cardiac risk assessment to determine her 10 year risk of cardiovascular disease.  Her 10 year risk based upon current numbers is only 2.4%.  This confirms her desire not to have to take statin therapy.

## 2014-03-02 NOTE — Assessment & Plan Note (Signed)
The patient has not been experiencing any chest pain or shortness of breath.  She has not had any sustained arrhythmias

## 2014-03-02 NOTE — Progress Notes (Signed)
Virginia Tate Date of Birth:  01/21/1956 [facility] [address] [place], [place]  [address] [phone]  Fax   [phone]  HPI: This pleasant 58 year old woman is seen for a one-year followup office visit. She has a past history of mild mitral valve prolapse. An echocardiogram in 2008 which showed a small degree of mitral valve prolapse and trace mitral regurgitation. She has a past history of PVCs which have improved with avoidance of caffeine with regular aerobic exercise. She remains on nadolol 40 mg daily. She has a past history of hyperlipidemia but because of favorable LDL particle size and low insulin resistance score she has not had to go on any statin therapy. Since last visit she has been doing well. She continues to exercise regularly. She works out with a Clinical research associate several times a week and also does yoga.  She is on a careful diet and her weight is down 13 pounds since last visit.   Current Outpatient Prescriptions  Medication Sig Dispense Refill  . aspirin 81 MG tablet Take 81 mg by mouth daily.        . Calcium Carbonate-Vitamin D (CALCIUM 600 + D PO) Take by mouth daily.        . cholecalciferol (VITAMIN D) 1000 UNITS tablet Take 1,000 Units by mouth daily.        . Coenzyme Q10 (COQ10 PO) Take by mouth.        . ESTRING 2 MG vaginal ring PLACE 1 RING VAGINALLY EVERY 3 (THREE) MONTHS. FOLLOW PACKAGE DIRECTIONS  1 each  0  . fish oil-omega-3 fatty acids 1000 MG capsule Take 1 g by mouth daily.        . magnesium 30 MG tablet Take 30 mg by mouth once.      . nadolol (CORGARD) 40 MG tablet TAKE ONE TABLET BY MOUTH ONE TIME DAILY  90 tablet  3  . Probiotic Product (SOLUBLE FIBER/PROBIOTICS PO) Take by mouth daily. Take 2 daily       No current facility-administered medications for this visit.    No Known Allergies  Patient Active Problem List   Diagnosis Date Noted  . LGSIL (low grade squamous intraepithelial dysplasia)   . MVP (mitral valve  prolapse) 02/21/2011  . Hyperlipidemia 02/21/2011  . Palpitation 02/21/2011    History  Smoking status  . Never Smoker   Smokeless tobacco  . Not on file    History  Alcohol Use  . 1.0 oz/week  . 2 drink(s) per week    Family History  Problem Relation Age of Onset  . Heart failure Father   . Hypertension Father   . Hypertension Mother   . Diabetes Sister   . Breast cancer Sister     Age 29  . Hypertension Brother   . Breast cancer Paternal Grandmother     Age 40's    Review of Systems: The patient denies any heat or cold intolerance.  No weight gain or weight loss.  The patient denies headaches or blurry vision.  There is no cough or sputum production.  The patient denies dizziness.  There is no hematuria or hematochezia.  The patient denies any muscle aches or arthritis.  The patient denies any rash.  The patient denies frequent falling or instability.  There is no history of depression or anxiety.  All other systems were reviewed and are negative.   Physical Exam: Filed Vitals:   03/02/14 1028  BP: 112/76  Pulse: 50   The patient appears to be in no distress.  Head and neck exam reveals that the pupils are equal and reactive.  The extraocular movements are full.  There is no scleral icterus.  Mouth and pharynx are benign.  No lymphadenopathy.  No carotid bruits.  The jugular venous pressure is normal.  Thyroid is not enlarged or tender.  Chest is clear to percussion and auscultation.  No rales or rhonchi.  Expansion of the chest is symmetrical.  Heart reveals no abnormal lift or heave.  First and second heart sounds are normal.  There is no murmur gallop rub or click.  No mitral valve click is heard today.  The abdomen is soft and nontender.  Bowel sounds are normoactive.  There is no hepatosplenomegaly or mass.  There are no abdominal bruits.  Extremities reveal no phlebitis or edema.  Pedal pulses are good.  There is no cyanosis or clubbing.  Neurologic exam  is normal strength and no lateralizing weakness.  No sensory deficits.  Integument reveals no rash  EKG shows sinus bradycardia, otherwise within normal limits.  No premature beats noted.  Assessment / Plan: 1. mild mitral valve prolapse 2. past history of ventricular arrhythmia, suppressed on nadolol 3. Hypercholesterolemia 4. good overall health  Plan: Her 10 year risk of cardiovascular event is only 2.4%.  She does not need to start statin therapy.  Her LDL particle size is large and favorable. Continue current medication.  Recheck in one year for office visit lipid panel hepatic function panel and basal metabolic panel

## 2014-03-02 NOTE — Patient Instructions (Signed)
Your physician recommends that you continue on your current medications as directed. Please refer to the Current Medication list given to you today.  Your physician wants you to follow-up in: 1 YEAR OV.LP/BMET/HFP You will receive a reminder letter in the mail two months in advance. If you don't receive a letter, please call our office to schedule the follow-up appointment.

## 2014-03-16 ENCOUNTER — Encounter: Payer: Self-pay | Admitting: Cardiology

## 2014-07-24 ENCOUNTER — Other Ambulatory Visit (INDEPENDENT_AMBULATORY_CARE_PROVIDER_SITE_OTHER): Payer: PRIVATE HEALTH INSURANCE | Admitting: *Deleted

## 2014-07-24 ENCOUNTER — Telehealth: Payer: Self-pay | Admitting: Cardiology

## 2014-07-24 ENCOUNTER — Ambulatory Visit (INDEPENDENT_AMBULATORY_CARE_PROVIDER_SITE_OTHER): Payer: PRIVATE HEALTH INSURANCE

## 2014-07-24 VITALS — BP 105/78 | HR 59 | Resp 18 | Wt 157.0 lb

## 2014-07-24 DIAGNOSIS — R002 Palpitations: Secondary | ICD-10-CM

## 2014-07-24 DIAGNOSIS — E7800 Pure hypercholesterolemia, unspecified: Secondary | ICD-10-CM

## 2014-07-24 DIAGNOSIS — I341 Nonrheumatic mitral (valve) prolapse: Secondary | ICD-10-CM

## 2014-07-24 LAB — BASIC METABOLIC PANEL
BUN: 18 mg/dL (ref 6–23)
CHLORIDE: 104 meq/L (ref 96–112)
CO2: 31 mEq/L (ref 19–32)
Calcium: 9.3 mg/dL (ref 8.4–10.5)
Creatinine, Ser: 0.91 mg/dL (ref 0.40–1.20)
GFR: 66.93 mL/min (ref >60.00)
Glucose, Bld: 104 mg/dL — ABNORMAL HIGH (ref 70–99)
POTASSIUM: 3.8 meq/L (ref 3.5–5.1)
Sodium: 138 mEq/L (ref 135–145)

## 2014-07-24 LAB — MAGNESIUM: Magnesium: 2.1 mg/dL (ref 1.5–2.5)

## 2014-07-24 NOTE — Addendum Note (Signed)
Addended by: Eulis Foster on: 07/24/2014 03:44 PM   Modules accepted: Orders

## 2014-07-24 NOTE — Telephone Encounter (Signed)
New message      Having increased irr heartbeat and wonder if she needs to be seen. Please call

## 2014-07-24 NOTE — Addendum Note (Signed)
Addended by: Eulis Foster on: 07/24/2014 03:42 PM   Modules accepted: Orders

## 2014-07-24 NOTE — Patient Instructions (Addendum)
Your physician recommends that you continue on your current medications as directed. Please refer to the Current Medication list given to you today.  Your physician has recommended that you wear a 24 hour holter monitor. Holter monitors are medical devices that record the heart's electrical activity. Doctors most often use these monitors to diagnose arrhythmias. Arrhythmias are problems with the speed or rhythm of the heartbeat. The monitor is a small, portable device. You can wear one while you do your normal daily activities. This is usually used to diagnose what is causing palpitations/syncope (passing out).  Your physician recommends that you schedule a follow-up appointment as needed.

## 2014-07-24 NOTE — Telephone Encounter (Signed)
Discussed with Dr. Radford Pax (DOD). Pt will come this afternoon for EKG and labs (BMET, MG) She is coming after work around [place].

## 2014-07-24 NOTE — Telephone Encounter (Signed)
Pt has long history of palpitations that have been controlled with lifestyle and Corgard. Starting approx 2 weeks ago, she feels "flutter" in her chest, they feel like her usual PVCs but are almost constant. She is not dizzy, lightheaded or SOB. Her radial pulse is irregular almost every time she checks it, which is several times daily.

## 2014-07-24 NOTE — Progress Notes (Signed)
1.) Reason for visit: EKG  2.) Name of MD requesting visit: Dr. Radford Pax (DOD)  3.) H&P: Patient has history mild mitral valve prolapse, PVC's, and hyperlipidemia. Patient's vital signs BP 105/78, HR 59, Resp. 18 and O2 99% on room air.    4.) ROS related to problem: Patient's vital signs BP 105/78, HR 59, Resp. 18 and O2 99% on room air.  Patient had EKG that showed sinus rhythm with occasional PVC's. Patient complaining of palpations. Patient is taking all her medications except for Estring. Patient states that when she stopped her Estring is when PVC's increased.  5.) Assessment and plan per MD: Dr. Radford Pax reviewed EKG with previous EKG (02/2014) and there is no significant change. Plan is for patient to have a 24 hour Holter monitor. Patient had a BMET and Mg labs today. Will await monitor and lab results for any follow up and changes. Patient is encouraged to avoid caffeine and alcohol.

## 2014-07-27 ENCOUNTER — Other Ambulatory Visit: Payer: Self-pay

## 2014-07-27 ENCOUNTER — Encounter: Payer: Self-pay | Admitting: *Deleted

## 2014-07-27 ENCOUNTER — Encounter (INDEPENDENT_AMBULATORY_CARE_PROVIDER_SITE_OTHER): Payer: PRIVATE HEALTH INSURANCE

## 2014-07-27 DIAGNOSIS — R002 Palpitations: Secondary | ICD-10-CM

## 2014-07-27 NOTE — Progress Notes (Signed)
Patient ID: Virginia Tate, female   DOB: 1956-04-12, 59 y.o.   MRN: [id number] [employer] 48 hour holter monitor applied to patient.

## 2014-07-28 ENCOUNTER — Other Ambulatory Visit (HOSPITAL_COMMUNITY): Payer: PRIVATE HEALTH INSURANCE

## 2014-07-29 ENCOUNTER — Ambulatory Visit (HOSPITAL_COMMUNITY): Payer: PRIVATE HEALTH INSURANCE | Attending: Cardiology

## 2014-07-29 ENCOUNTER — Ambulatory Visit
Admission: RE | Admit: 2014-07-29 | Discharge: 2014-07-29 | Disposition: A | Discharge status: Home / Self Care | Payer: PRIVATE HEALTH INSURANCE | Source: Ambulatory Visit | Attending: Cardiology | Admitting: Cardiology

## 2014-07-29 DIAGNOSIS — R002 Palpitations: Secondary | ICD-10-CM | POA: Insufficient documentation

## 2014-07-29 NOTE — Progress Notes (Signed)
2D Echo completed. 07/29/2014 

## 2014-08-03 ENCOUNTER — Encounter: Payer: Self-pay | Admitting: Cardiology

## 2014-08-06 ENCOUNTER — Ambulatory Visit (INDEPENDENT_AMBULATORY_CARE_PROVIDER_SITE_OTHER): Payer: PRIVATE HEALTH INSURANCE | Admitting: Cardiology

## 2014-08-06 ENCOUNTER — Encounter: Payer: Self-pay | Admitting: Cardiology

## 2014-08-06 VITALS — BP 104/68 | HR 55 | Ht 67.0 in | Wt 153.1 lb

## 2014-08-06 DIAGNOSIS — R002 Palpitations: Secondary | ICD-10-CM | POA: Diagnosis not present

## 2014-08-06 DIAGNOSIS — I493 Ventricular premature depolarization: Secondary | ICD-10-CM

## 2014-08-06 DIAGNOSIS — I491 Atrial premature depolarization: Secondary | ICD-10-CM

## 2014-08-06 MED ORDER — METOPROLOL SUCCINATE ER 25 MG PO TB24: 25.0000 mg | ORAL_TABLET | Freq: Every day | ORAL | Status: DC

## 2014-08-06 NOTE — Progress Notes (Signed)
Cardiology Office Note   Date:  08/06/2014   ID:  Virginia Tate, DOB Mar 04, 1956, MRN [id number]  PCP:  Ileana Roup, MD  Cardiologist:   Darlin Coco, MD   No chief complaint on file.     History of Present Illness: Virginia Tate is a 59 y.o. female who presents for a work in follow-up office visit  This 60 year old woman has a past history of mild mitral valve prolapse. An echocardiogram in 2008 which showed a small degree of mitral valve prolapse and trace mitral regurgitation. She has a past history of PVCs which have improved with avoidance of caffeine with regular aerobic exercise. She remains on nadolol 40 mg daily. She has a past history of hyperlipidemia but because of favorable LDL particle size and low insulin resistance score she has not had to go on any statin therapy.  She comes in now because of concern about increasing cardiac arrhythmia.  She was in her usual state of health until one month ago.  She was down in [place] helping a relative plan a wedding.  It was moderately stressful but not excessively so.  She began to notice that her heart was more irregular.  After she got back to [facility] her arrhythmia persisted and she became more concerned.  The patient stays physically active.  She works out with a Clinical research associate several times a week.  She does Pilates.  She plays golf.  She no longer plays tennis. Because of her increased palpitations she underwent an 2-D echocardiogram on 07/29/14 which showed normal left ventricular systolic function with ejection fraction 60-65%.  There was trivial mitral regurgitation.. 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 She had a 48-hour Holter monitor which did not show any malignant arrhythmias.  She is in normal sinus rhythm.  There was no atrial fibrillation.  She had scattered premature atrial beats and premature ventricular beats.  Some of the premature beats were interpolated.  She comes in to the office  today to review her findings.  The patient denies any chest pain or shortness of breath.  Past Medical History  Diagnosis Date  . Palpitations   . PVC's (premature ventricular contractions)   . Hyperlipidemia     has favorable LDL particle size and low insulin resistance score.  Marland Kitchen LGSIL (low grade squamous intraepithelial dysplasia)   . MVP (mitral valve prolapse)     No antibiotics required  . Bartholin's gland abscess     Past Surgical History  Procedure Laterality Date  . US echocardiography  09/03/2006    EF 55-60%  . Cervical biopsy  w/ loop electrode excision    . Colposcopy    . Cesarean section    . Tonsillectomy and adenoidectomy    . Appendectomy    . Augmentation mammaplasty       Current Outpatient Prescriptions  Medication Sig Dispense Refill  . aspirin 81 MG tablet Take 81 mg by mouth daily.      . Calcium Carbonate-Vitamin D (CALCIUM 600 + D PO) Take by mouth daily.      . cholecalciferol (VITAMIN D) 1000 UNITS tablet Take 1,000 Units by mouth daily.      . Coenzyme Q10 (COQ10 PO) Take by mouth.      . fish oil-omega-3 fatty acids 1000 MG capsule Take 1 g by mouth daily.      . magnesium 30 MG tablet Take 30 mg by mouth once.    . Probiotic Product (SOLUBLE  FIBER/PROBIOTICS PO) Take by mouth daily. Take 2 daily    . metoprolol succinate (TOPROL XL) 25 MG 24 hr tablet Take 1 tablet (25 mg total) by mouth daily. 30 tablet 5   No current facility-administered medications for this visit.    Allergies:   Review of patient's allergies indicates no known allergies.    Social History:  The patient  reports that she has never smoked. She does not have any smokeless tobacco history on file. She reports that she drinks about 1.0 oz of alcohol per week. She reports that she does not use illicit drugs.   Family History:  The patient's family history includes Breast cancer in her paternal grandmother and sister; Diabetes in her sister; Heart failure in her father;  Hypertension in her brother, father, and mother.    ROS:  Please see the history of present illness.   Otherwise, review of systems are positive for none.   All other systems are reviewed and negative.    PHYSICAL EXAM: VS:  BP 104/68 mmHg  Pulse 55  Ht 5\' 7"  (1.702 m)  Wt 153 lb 1.9 oz (69.455 kg)  BMI 23.98 kg/m2 , BMI Body mass index is 23.98 kg/(m^2). GEN: Well nourished, well developed, in no acute distress HEENT: normal Neck: no JVD, carotid bruits, or masses Cardiac: RRR; no murmurs, rubs, or gallops,no edema  Respiratory:  clear to auscultation bilaterally, normal work of breathing GI: soft, nontender, nondistended, + BS MS: no deformity or atrophy Skin: warm and dry, no rash Neuro:  Strength and sensation are intact Psych: euthymic mood, full affect   EKG:  EKG is not ordered today.   Recent Labs: 02/20/2014: ALT 17 07/24/2014: BUN 18; Creatinine 0.91; Magnesium 2.1; Potassium 3.8; Sodium 138    Lipid Panel    Component Value Date/Time   CHOL 239* 02/20/2014 1535   TRIG 59.0 02/20/2014 1535   HDL 62.70 02/20/2014 1535   CHOLHDL 4 02/20/2014 1535   VLDL 11.8 02/20/2014 1535   LDLCALC 165* 02/20/2014 1535   LDLDIRECT 154.1 02/28/2013 0809      Wt Readings from Last 3 Encounters:  08/06/14 153 lb 1.9 oz (69.455 kg)  07/24/14 157 lb (71.215 kg)  03/02/14 154 lb (69.854 kg)       ASSESSMENT AND PLAN:  1.  Minimal mitral valve prolapse and minimal mitral regurgitation.  Good LV systolic function. 2.  Sporadic PACs and PVCs on Holter monitor, no evidence of ventricular tachycardia or of atrial fibrillation. 3.  Hypercholesterolemia  Disposition: We are going to stop the nadolol at this point.  It does not appear to be controlling her arrhythmia.  We will switch to Toprol-XL 25 mg one daily.  This will be a starting dose.  Her blood pressure tends to be low normal.  She will keep [place] posted by telephone and asked how she is doing.  We may need to go up on the  dose if her blood pressure tolerates and if she is still having symptoms. Recheck for routine follow-up office visit and EKG in about 3 months She was reassured that we did not find any arrhythmia.  I showed her the Holter monitor strips.. Current medicines are reviewed at length with the patient today.  The patient does not have concerns regarding medicines.  The following changes have been made:  Switch to Toprol 25 mg daily  Labs/ tests ordered today include:  No orders of the defined types were placed in this encounter.  Disposition: Recheck in 3 months for office visit and EKG.  She will check her pressures at home to see how she is tolerating the Toprol.  We may need to go up on the dose if necessary.  She will keep [place] posted by telephone.  Signed, Darlin Coco, MD  08/06/2014 1:28 PM    [facility] [address], [place], [place]  [address] Phone: [phone]; Fax: [phone]

## 2014-08-06 NOTE — Patient Instructions (Signed)
STOP YOUR CORGARD  START TOPROL (METOPROLOL) 25 MG DAILY, RX SENT TO PHARMACY  Your physician recommends that you schedule a follow-up appointment in: 3 MONTH OV/EKG

## 2014-08-12 ENCOUNTER — Encounter: Payer: Self-pay | Admitting: Cardiology

## 2014-08-14 ENCOUNTER — Encounter: Payer: Self-pay | Admitting: Cardiology

## 2014-08-17 ENCOUNTER — Telehealth: Payer: Self-pay | Admitting: *Deleted

## 2014-08-17 MED ORDER — METOPROLOL SUCCINATE ER 50 MG PO TB24: 50.0000 mg | ORAL_TABLET | Freq: Every day | ORAL | Status: DC

## 2014-08-17 NOTE — Telephone Encounter (Signed)
From  Darlin Coco, MD   To  [place] and Delivered  08/13/2014 5:47 PM      Last Read in [place]  08/13/2014 8:52 PM by [name]     I would go up on the dose to 50 mg daily.  Warren Danes    Dr. Mare Ferrari:   I have been on the new med for a week now. Thinks are better definitely, but not resolved. Should I up the dose yet or try the 25mg  a little longer?  Thanks.  Lodema Pilot

## 2014-09-04 ENCOUNTER — Other Ambulatory Visit: Payer: Self-pay

## 2014-09-04 DIAGNOSIS — Z1231 Encounter for screening mammogram for malignant neoplasm of breast: Secondary | ICD-10-CM

## 2014-09-21 ENCOUNTER — Encounter (HOSPITAL_BASED_OUTPATIENT_CLINIC_OR_DEPARTMENT_OTHER): Payer: Self-pay

## 2014-09-21 ENCOUNTER — Emergency Department (HOSPITAL_BASED_OUTPATIENT_CLINIC_OR_DEPARTMENT_OTHER)
Admission: EM | Admit: 2014-09-21 | Discharge: 2014-09-21 | Disposition: A | Discharge status: Home / Self Care | Payer: PRIVATE HEALTH INSURANCE | Attending: Emergency Medicine | Admitting: Emergency Medicine

## 2014-09-21 ENCOUNTER — Emergency Department (HOSPITAL_COMMUNITY): Admission: EM | Admit: 2014-09-21 | Discharge: 2014-09-21 | Payer: Self-pay

## 2014-09-21 ENCOUNTER — Emergency Department (HOSPITAL_BASED_OUTPATIENT_CLINIC_OR_DEPARTMENT_OTHER): Payer: PRIVATE HEALTH INSURANCE

## 2014-09-21 DIAGNOSIS — Z8679 Personal history of other diseases of the circulatory system: Secondary | ICD-10-CM | POA: Insufficient documentation

## 2014-09-21 DIAGNOSIS — Z8742 Personal history of other diseases of the female genital tract: Secondary | ICD-10-CM | POA: Diagnosis not present

## 2014-09-21 DIAGNOSIS — R109 Unspecified abdominal pain: Secondary | ICD-10-CM | POA: Insufficient documentation

## 2014-09-21 DIAGNOSIS — Z9889 Other specified postprocedural states: Secondary | ICD-10-CM | POA: Diagnosis not present

## 2014-09-21 DIAGNOSIS — Z7982 Long term (current) use of aspirin: Secondary | ICD-10-CM | POA: Diagnosis not present

## 2014-09-21 DIAGNOSIS — E785 Hyperlipidemia, unspecified: Secondary | ICD-10-CM | POA: Insufficient documentation

## 2014-09-21 DIAGNOSIS — Z79899 Other long term (current) drug therapy: Secondary | ICD-10-CM | POA: Insufficient documentation

## 2014-09-21 DIAGNOSIS — Z9089 Acquired absence of other organs: Secondary | ICD-10-CM | POA: Diagnosis not present

## 2014-09-21 LAB — BASIC METABOLIC PANEL
Anion gap: 7 (ref 5–15)
BUN: 20 mg/dL (ref 6–20)
CHLORIDE: 102 mmol/L (ref 101–111)
CO2: 30 mmol/L (ref 22–32)
Calcium: 10.2 mg/dL (ref 8.9–10.3)
Creatinine, Ser: 0.82 mg/dL (ref 0.44–1.00)
GFR calc Af Amer: >60 mL/min (ref >60)
GFR calc non Af Amer: >60 mL/min (ref >60)
Glucose, Bld: 105 mg/dL — ABNORMAL HIGH (ref 70–99)
Potassium: 3.6 mmol/L (ref 3.5–5.1)
Sodium: 139 mmol/L (ref 135–145)

## 2014-09-21 LAB — URINALYSIS, ROUTINE W REFLEX MICROSCOPIC
BILIRUBIN URINE: NEGATIVE
GLUCOSE, UA: NEGATIVE mg/dL
Hgb urine dipstick: NEGATIVE
Ketones, ur: NEGATIVE mg/dL
LEUKOCYTES UA: NEGATIVE
Nitrite: NEGATIVE
PH: 7.5 (ref 5.0–8.0)
Protein, ur: NEGATIVE mg/dL
Specific Gravity, Urine: 1.019 (ref 1.005–1.030)
Urobilinogen, UA: 0.2 mg/dL (ref 0.0–1.0)

## 2014-09-21 LAB — URINE MICROSCOPIC-ADD ON

## 2014-09-21 MED ORDER — HYDROMORPHONE HCL 1 MG/ML IJ SOLN
1.0000 mg | Freq: Once | INTRAMUSCULAR | Status: AC
Start: 2014-09-21 — End: 2014-09-21
  Administered 2014-09-21: 1 mg via INTRAVENOUS
  Filled 2014-09-21: qty 1

## 2014-09-21 MED ORDER — OXYCODONE-ACETAMINOPHEN 5-325 MG PO TABS: 1.0000 | ORAL_TABLET | Freq: Four times a day (QID) | ORAL | Status: DC | PRN

## 2014-09-21 MED ORDER — ONDANSETRON HCL 4 MG/2ML IJ SOLN
4.0000 mg | Freq: Once | INTRAMUSCULAR | Status: AC
  Administered 2014-09-21: 4 mg via INTRAVENOUS
  Filled 2014-09-21: qty 2

## 2014-09-21 MED ORDER — HYDROMORPHONE HCL 1 MG/ML IJ SOLN
1.0000 mg | Freq: Once | INTRAMUSCULAR | Status: AC
  Administered 2014-09-21: 1 mg via INTRAVENOUS
  Filled 2014-09-21: qty 1

## 2014-09-21 MED ORDER — SODIUM CHLORIDE 0.9 % IV BOLUS (SEPSIS)
500.0000 mL | Freq: Once | INTRAVENOUS | Status: AC
  Administered 2014-09-21: 999 mL via INTRAVENOUS

## 2014-09-21 MED ORDER — IBUPROFEN 800 MG PO TABS: 800.0000 mg | ORAL_TABLET | Freq: Three times a day (TID) | ORAL | Status: DC

## 2014-09-21 MED ORDER — KETOROLAC TROMETHAMINE 30 MG/ML IJ SOLN
30.0000 mg | Freq: Once | INTRAMUSCULAR | Status: AC
  Administered 2014-09-21: 30 mg via INTRAVENOUS
  Filled 2014-09-21: qty 1

## 2014-09-21 NOTE — ED Notes (Signed)
Patient transported to CT 

## 2014-09-21 NOTE — Discharge Instructions (Signed)
Flank Pain °Flank pain refers to pain that is located on the side of the body between the upper abdomen and the back. The pain may occur over a short period of time (acute) or may be long-term or reoccurring (chronic). It may be mild or severe. Flank pain can be caused by many things. °CAUSES  °Some of the more common causes of flank pain include: °· Muscle strains.   °· Muscle spasms.   °· A disease of your spine (vertebral disk disease).   °· A lung infection (pneumonia).   °· Fluid around your lungs (pulmonary edema).   °· A kidney infection.   °· Kidney stones.   °· A very painful skin rash caused by the chickenpox virus (shingles).   °· Gallbladder disease.   °HOME CARE INSTRUCTIONS  °Home care will depend on the cause of your pain. In general, °· Rest as directed by your caregiver. °· Drink enough fluids to keep your urine clear or pale yellow. °· Only take over-the-counter or prescription medicines as directed by your caregiver. Some medicines may help relieve the pain. °· Tell your caregiver about any changes in your pain. °· Follow up with your caregiver as directed. °SEEK IMMEDIATE MEDICAL CARE IF:  °· Your pain is not controlled with medicine.   °· You have new or worsening symptoms. °· Your pain increases.   °· You have abdominal pain.   °· You have shortness of breath.   °· You have persistent nausea or vomiting.   °· You have swelling in your abdomen.   °· You feel faint or pass out.   °· You have blood in your urine. °· You have a fever or persistent symptoms for more than 2-3 days. °· You have a fever and your symptoms suddenly get worse. °MAKE SURE YOU:  °· Understand these instructions. °· Will watch your condition. °· Will get help right away if you are not doing well or get worse. °Document Released: 06/22/2005 Document Revised: 01/24/2012 Document Reviewed: 12/14/2011 °ExitCare® Patient Information ©2015 [employer]. This information is not intended to replace advice given to you by your  health care provider. Make sure you discuss any questions you have with your health care provider. ° °

## 2014-09-21 NOTE — ED Provider Notes (Signed)
CSN: [id number]     Arrival date & time 09/21/14  1149 History   First MD Initiated Contact with Patient 09/21/14 1200     Chief Complaint  Patient presents with  . Flank Pain     (Consider location/radiation/quality/duration/timing/severity/associated sxs/prior Treatment) Patient is a 59 y.o. female presenting with flank pain. The history is provided by the patient. No language interpreter was used.  Flank Pain This is a new problem. The current episode started today. The problem occurs constantly. The problem has been unchanged. Pertinent negatives include no abdominal pain, coughing, fever, neck pain, numbness or rash. Nothing aggravates the symptoms. She has tried NSAIDs for the symptoms.    Past Medical History  Diagnosis Date  . Palpitations   . PVC's (premature ventricular contractions)   . Hyperlipidemia     has favorable LDL particle size and low insulin resistance score.  Marland Kitchen LGSIL (low grade squamous intraepithelial dysplasia)   . MVP (mitral valve prolapse)     No antibiotics required  . Bartholin's gland abscess    Past Surgical History  Procedure Laterality Date  . US echocardiography  09/03/2006    EF 55-60%  . Cervical biopsy  w/ loop electrode excision    . Colposcopy    . Cesarean section    . Tonsillectomy and adenoidectomy    . Appendectomy    . Augmentation mammaplasty     Family History  Problem Relation Age of Onset  . Heart failure Father   . Hypertension Father   . Hypertension Mother   . Diabetes Sister   . Breast cancer Sister     Age 18  . Hypertension Brother   . Breast cancer Paternal Grandmother     Age 76's   History  Substance Use Topics  . Smoking status: Never Smoker   . Smokeless tobacco: Not on file  . Alcohol Use: 1.0 oz/week    2 drink(s) per week   OB History    Gravida Para Term Preterm AB TAB SAB Ectopic Multiple Living   2 2 2      1 3      Review of Systems  Constitutional: Negative for fever.  Respiratory: Negative  for cough.   Gastrointestinal: Negative for abdominal pain.  Genitourinary: Positive for flank pain.  Musculoskeletal: Negative for neck pain.  Skin: Negative for rash.  Neurological: Negative for numbness.  All other systems reviewed and are negative.     Allergies  Review of patient's allergies indicates no known allergies.  Home Medications   Prior to Admission medications   Medication Sig Start Date End Date Taking? Authorizing Provider  aspirin 81 MG tablet Take 81 mg by mouth daily.      Historical Provider, MD  Calcium Carbonate-Vitamin D (CALCIUM 600 + D PO) Take by mouth daily.      Historical Provider, MD  cholecalciferol (VITAMIN D) 1000 UNITS tablet Take 1,000 Units by mouth daily.      Historical Provider, MD  Coenzyme Q10 (COQ10 PO) Take by mouth.      Historical Provider, MD  fish oil-omega-3 fatty acids 1000 MG capsule Take 1 g by mouth daily.      Historical Provider, MD  magnesium 30 MG tablet Take 30 mg by mouth once.    Historical Provider, MD  metoprolol succinate (TOPROL-XL) 50 MG 24 hr tablet Take 1 tablet (50 mg total) by mouth daily. 08/17/14   Darlin Coco, MD  Probiotic Product (SOLUBLE FIBER/PROBIOTICS PO) Take by mouth daily.  Take 2 daily    Historical Provider, MD   BP 136/69 mmHg  Pulse 63  Temp(Src) 98.2 F (36.8 C) (Oral)  Resp 14  Ht 5\' 7"  (1.702 m)  Wt 155 lb (70.308 kg)  BMI 24.27 kg/m2  SpO2 100% Physical Exam  Constitutional: She is oriented to person, place, and time. She appears well-developed and well-nourished.  HENT:  Head: Normocephalic and atraumatic.  Cardiovascular: Normal rate and regular rhythm.   Pulmonary/Chest: Effort normal and breath sounds normal.  Abdominal: Soft. Bowel sounds are normal. There is no tenderness.  Musculoskeletal: Normal range of motion. She exhibits no tenderness.  Neurological: She is alert and oriented to person, place, and time. She exhibits normal muscle tone. Coordination normal.  Skin: Skin  is warm and dry.  Nursing note and vitals reviewed.   ED Course  Procedures (including critical care time) Labs Review Labs Reviewed  URINALYSIS, ROUTINE W REFLEX MICROSCOPIC - Abnormal; Notable for the following:    APPearance TURBID (*)    All other components within normal limits  BASIC METABOLIC PANEL - Abnormal; Notable for the following:    Glucose, Bld 105 (*)    All other components within normal limits  URINE MICROSCOPIC-ADD ON    Imaging Review Ct Renal Stone Study  09/21/2014   CLINICAL DATA:  One day history of right flank pain  EXAM: CT ABDOMEN AND PELVIS WITHOUT CONTRAST  TECHNIQUE: Multidetector CT imaging of the abdomen and pelvis was performed following the standard protocol without oral or intravenous contrast material administration.  COMPARISON:  None.  FINDINGS: There is slight posterior bibasilar atelectasis. Lung bases are otherwise clear.  No focal liver lesions are identified on this noncontrast enhanced study. Gallbladder wall is not thickened. There is no biliary duct dilatation.  Spleen, pancreas, and adrenals appear normal.  Kidneys bilaterally show no mass or hydronephrosis on either side. There is no renal or ureteral calculus on either side.  In the pelvis, the urinary bladder is midline with normal wall thickness. There are multiple phleboliths in the pelvis which are near but separate from the distal ureters. There is no pelvic mass or pelvic fluid collection. Appendix is absent. There is no periappendiceal region inflammation.  There is no bowel obstruction.  No free air or portal venous air.  There is no ascites, adenopathy, or abscess in the abdomen or pelvis. There is no abdominal aortic aneurysm. There is degenerative change in the lumbar spine. There are no blastic or lytic bone lesions.  IMPRESSION: No renal or ureteral calculus. No hydronephrosis. No bowel obstruction. No abscess. Appendix is absent.   Electronically Signed   By: Lowella Grip III M.D.    On: 09/21/2014 12:57     EKG Interpretation None      MDM   Final diagnoses:  Flank pain    No definite source for pain. Pt feeling a little better with pain medication. Will send home with percocet and ibuprofen. No rash. No definite musculoskeletal source    Glendell Docker, NP 09/21/14 [place] Ward, DO 09/22/14 [phone]

## 2014-09-21 NOTE — ED Notes (Signed)
Reports pain to right flank since 0800 this AM. Reports some nausea no vomiting. No urinary symptoms.

## 2014-10-22 ENCOUNTER — Encounter: Payer: Self-pay | Admitting: Cardiology

## 2014-10-22 ENCOUNTER — Ambulatory Visit (INDEPENDENT_AMBULATORY_CARE_PROVIDER_SITE_OTHER): Payer: PRIVATE HEALTH INSURANCE | Admitting: Cardiology

## 2014-10-22 ENCOUNTER — Ambulatory Visit
Admission: RE | Admit: 2014-10-22 | Discharge: 2014-10-22 | Disposition: A | Discharge status: Home / Self Care | Payer: PRIVATE HEALTH INSURANCE | Source: Ambulatory Visit

## 2014-10-22 VITALS — BP 122/70 | HR 61 | Ht 67.0 in | Wt 159.1 lb

## 2014-10-22 DIAGNOSIS — I493 Ventricular premature depolarization: Secondary | ICD-10-CM | POA: Diagnosis not present

## 2014-10-22 DIAGNOSIS — Z1231 Encounter for screening mammogram for malignant neoplasm of breast: Secondary | ICD-10-CM

## 2014-10-22 NOTE — Progress Notes (Signed)
Cardiology Office Note   Date:  10/22/2014   ID:  Virginia Tate, DOB January 13, 1956, MRN [id number]  PCP:  Lottie Dawson, MD  Cardiologist: Darlin Coco MD  No chief complaint on file.     History of Present Illness: Virginia Tate is a 59 y.o. female who presents for 3 month follow-up office visit.   This 59 year old woman has a past history of mild mitral valve prolapse. An echocardiogram in 2008 which showed a small degree of mitral valve prolapse and trace mitral regurgitation. She has a past history of PVCs which have improved with avoidance of caffeine with regular aerobic exercise. She has a past history of hyperlipidemia but because of favorable LDL particle size and low insulin resistance score she has not had to go on any statin therapy.   The patient stays physically active. She works out with a Clinical research associate several times a week. She does Pilates. She plays golf. She no longer plays tennis.  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  She does a lot of flying to other cities to help clients. Because of her increased palpitations she underwent an 2-D echocardiogram on 07/29/14 which showed normal left ventricular systolic function with ejection fraction 60-65%. There was trivial mitral regurgitation.. 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 She had a 48-hour Holter monitor which did not show any malignant arrhythmias. She is in normal sinus rhythm. There was no atrial fibrillation. She had scattered premature atrial beats and premature ventricular beats. Some of the premature beats were interpolated. The patient previously had been on nadolol which seemed no longer to be effective.  We switched her to metoprolol succinate.  She initially was on 25 mg daily and we have increased it to 50 mg daily.  On this regimen she is doing fairly well with just occasional palpitations which do not bother her  much.  No chest pain.  No dizziness or syncope.  Past Medical History  Diagnosis Date  . Palpitations   . PVC's (premature ventricular contractions)   . Hyperlipidemia     has favorable LDL particle size and low insulin resistance score.  Marland Kitchen LGSIL (low grade squamous intraepithelial dysplasia)   . MVP (mitral valve prolapse)     No antibiotics required  . Bartholin's gland abscess     Past Surgical History  Procedure Laterality Date  . US echocardiography  09/03/2006    EF 55-60%  . Cervical biopsy  w/ loop electrode excision    . Colposcopy    . Cesarean section    . Tonsillectomy and adenoidectomy    . Appendectomy    . Augmentation mammaplasty       Current Outpatient Prescriptions  Medication Sig Dispense Refill  . aspirin 81 MG tablet Take 81 mg by mouth daily.      . Calcium Carbonate-Vitamin D (CALCIUM 600 + D PO) Take 1 tablet by mouth daily.     . cholecalciferol (VITAMIN D) 1000 UNITS tablet Take 1,000 Units by mouth daily.      . Coenzyme Q10 (COQ10 PO) Take 1 tablet by mouth daily.     . fish oil-omega-3 fatty acids 1000 MG capsule Take 1 g by mouth daily.      . magnesium 30 MG tablet Take 30 mg by mouth once.    . metoprolol succinate (TOPROL-XL) 50 MG 24 hr tablet Take 1 tablet (50 mg total) by mouth daily. [place]ado  tablet 5  . Probiotic Product (SOLUBLE FIBER/PROBIOTICS PO) Take 1 capsule by mouth daily.      No current facility-administered medications for this visit.    Allergies:   Review of patient's allergies indicates no known allergies.    Social History:  The patient  reports that she has never smoked. She does not have any smokeless tobacco history on file. She reports that she drinks about 1.0 oz of alcohol per week. She reports that she does not use illicit drugs.   Family History:  The patient's family history includes Breast cancer in her paternal grandmother and sister; Diabetes in her sister; Heart failure in her father; Hypertension in her  brother, father, and mother. she mentioned that her sister also has a similar lipid profile to the patient.  The sister had coronary artery calcium scoring which was 0 and her physician took her off statins.   ROS:  Please see the history of present illness.   Otherwise, review of systems are positive for none.   All other systems are reviewed and negative.    PHYSICAL EXAM: VS:  BP 122/70 mmHg  Pulse 61  Ht 5\' 7"  (1.702 m)  Wt 159 lb 1.9 oz (72.176 kg)  BMI 24.92 kg/m2 , BMI Body mass index is 24.92 kg/(m^2). GEN: Well nourished, well developed, in no acute distress HEENT: normal Neck: no JVD, carotid bruits, or masses Cardiac: RRR; no murmurs, rubs, or gallops,no edema .  No Click heard today Respiratory:  clear to auscultation bilaterally, normal work of breathing GI: soft, nontender, nondistended, + BS MS: no deformity or atrophy Skin: warm and dry, no rash Neuro:  Strength and sensation are intact Psych: euthymic mood, full affect   EKG:  EKG is ordered today. The ekg ordered today demonstrates normal sinus rhythm.  Within normal limits.   Recent Labs: 02/20/2014: ALT 17 07/24/2014: Magnesium 2.1 09/21/2014: BUN 20; Creatinine, Ser 0.82; Potassium 3.6; Sodium 139    Lipid Panel    Component Value Date/Time   CHOL 239* 02/20/2014 1535   TRIG 59.0 02/20/2014 1535   HDL 62.70 02/20/2014 1535   CHOLHDL 4 02/20/2014 1535   VLDL 11.8 02/20/2014 1535   LDLCALC 165* 02/20/2014 1535   LDLDIRECT 154.1 02/28/2013 0809      Wt Readings from Last 3 Encounters:  10/22/14 159 lb 1.9 oz (72.176 kg)  09/21/14 155 lb (70.308 kg)  08/06/14 153 lb 1.9 oz (69.455 kg)       ASSESSMENT AND PLAN:  1. Minimal mitral valve prolapse and minimal mitral regurgitation. Good LV systolic function. 2. Sporadic PACs and PVCs on Holter monitor, no evidence of ventricular tachycardia or of atrial fibrillation. 3. Hypercholesterolemia  Disposition: Continue current medication.  Recheck  in one year    Current medicines are reviewed at length with the patient today.  The patient does not have concerns regarding medicines.  The following changes have been made:  no change  Labs/ tests ordered today include:  No orders of the defined types were placed in this encounter.   Berna Spare MD 10/22/2014 8:34 AM    [address], [place], [place]  [address] Phone: [phone]; Fax: [phone]

## 2014-10-22 NOTE — Patient Instructions (Signed)
Medication Instructions:  Your physician recommends that you continue on your current medications as directed. Please refer to the Current Medication list given to you today.  Labwork: none  Testing/Procedures: none  Follow-Up: Your physician wants you to follow-up in: 1 year ov/ekg You will receive a reminder letter in the mail two months in advance. If you don't receive a letter, please call our office to schedule the follow-up appointment.     

## 2015-01-01 ENCOUNTER — Encounter: Payer: Self-pay | Admitting: Obstetrics and Gynecology

## 2015-01-01 ENCOUNTER — Ambulatory Visit (INDEPENDENT_AMBULATORY_CARE_PROVIDER_SITE_OTHER): Payer: PRIVATE HEALTH INSURANCE | Admitting: Obstetrics and Gynecology

## 2015-01-01 VITALS — BP 94/60 | HR 70 | Ht 66.0 in | Wt 159.2 lb

## 2015-01-01 DIAGNOSIS — Z01419 Encounter for gynecological examination (general) (routine) without abnormal findings: Secondary | ICD-10-CM | POA: Diagnosis not present

## 2015-01-01 DIAGNOSIS — Z0000 Encounter for general adult medical examination without abnormal findings: Secondary | ICD-10-CM | POA: Diagnosis not present

## 2015-01-01 LAB — POCT URINALYSIS DIPSTICK
Leukocytes, UA: NEGATIVE
Urobilinogen, UA: NEGATIVE
pH, UA: 5

## 2015-01-01 NOTE — Progress Notes (Signed)
59 y.o. G50P2003 Married Caucasian female here for annual exam.   LMP 05/15/2006.  No postmenopausal bleeding  No HRT.  Used Estring for a short period of time.   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  Hx of LEEP.   Has bilateral breast implants.   Hx of low vit D.   Enjoys her beach house.  Husband is radiologist, Dr. Rozetta Nunnery.   PCP:  Jenean Lindau, MD Cardiologist:  Darlin Coco, MD   No LMP recorded. Patient is postmenopausal.          Sexually active: Yes.    The current method of family planning is post menopausal status.    Exercising: Yes.    Personal trainer, cardio, pilates 4x/wk Smoker:  no  Health Maintenance: Pap:  10/23/13 wnl hr hpv positive  History of abnormal Pap:  Yes, hx of LEEP with Dr. Cherylann Banas 10 years ago, had pap q 3 months for a year all paps were normal  MMG:  10/22/14 breast density category b; bi-rads 2; benign Colonoscopy: 02/24/2010 Normal f/u in 2021 BMD: 09/09/2009   Result  Normal TDaP:  2012 Screening Labs:  Hgb today: Cardiologist, Urine today: Negative    reports that she has never smoked. She does not have any smokeless tobacco history on file. She reports that she drinks about 1.0 oz of alcohol per week. She reports that she does not use illicit drugs.  Past Medical History  Diagnosis Date  . Palpitations   . PVC's (premature ventricular contractions)   . Hyperlipidemia     has favorable LDL particle size and low insulin resistance score.  Marland Kitchen LGSIL (low grade squamous intraepithelial dysplasia)   . MVP (mitral valve prolapse)     No antibiotics required  . Bartholin's gland abscess   . Heart murmur     Past Surgical History  Procedure Laterality Date  . US echocardiography  09/03/2006    EF 55-60%  . Cervical biopsy  w/ loop electrode excision    . Colposcopy    . Cesarean section    . Tonsillectomy and adenoidectomy    . Appendectomy    . Augmentation mammaplasty      Current Outpatient Prescriptions   Medication Sig Dispense Refill  . aspirin 81 MG tablet Take 81 mg by mouth daily.      . Calcium Carbonate-Vitamin D (CALCIUM 600 + D PO) Take 1 tablet by mouth daily.     . cholecalciferol (VITAMIN D) 1000 UNITS tablet Take 1,000 Units by mouth daily.      . Coenzyme Q10 (COQ10 PO) Take 1 tablet by mouth daily.     . fish oil-omega-3 fatty acids 1000 MG capsule Take 1 g by mouth daily.      . magnesium 30 MG tablet Take 30 mg by mouth once.    . metoprolol succinate (TOPROL-XL) 50 MG 24 hr tablet Take 1 tablet (50 mg total) by mouth daily. 30 tablet 5  . Probiotic Product (SOLUBLE FIBER/PROBIOTICS PO) Take 1 capsule by mouth daily.      No current facility-administered medications for this visit.    Family History  Problem Relation Age of Onset  . Heart failure Father   . Hypertension Father   . Hypertension Mother   . Diabetes Sister   . Breast cancer Sister     Age 76  . Hypertension Brother   . Breast cancer Paternal Grandmother     Age 77's  . Dementia Mother  ROS:  Pertinent items are noted in HPI.  Otherwise, a comprehensive ROS was negative.  Exam:   BP 94/60 mmHg  Pulse 70  Ht 5\' 6"  (1.676 m)  Wt 159 lb 3.2 oz (72.213 kg)  BMI 25.71 kg/m2    General appearance: alert, cooperative and appears stated age Head: Normocephalic, without obvious abnormality, atraumatic Neck: no adenopathy, supple, symmetrical, trachea midline and thyroid normal to inspection and palpation Lungs: clear to auscultation bilaterally Breasts: normal appearance, no masses or tenderness, Inspection negative, No nipple retraction or dimpling, No nipple discharge or bleeding, No axillary or supraclavicular adenopathy.  Bilateral implants. Heart: regular rate and rhythm Abdomen: soft, non-tender; bowel sounds normal; no masses,  no organomegaly Extremities: extremities normal, atraumatic, no cyanosis or edema Skin: Skin color, texture, turgor normal. No rashes or lesions Lymph nodes:  Cervical, supraclavicular, and axillary nodes normal. No abnormal inguinal nodes palpated Neurologic: Grossly normal  Pelvic: External genitalia:  no lesions              Urethra:  normal appearing urethra with no masses, tenderness or lesions              Bartholins and Skenes: normal                 Vagina: normal appearing vagina with normal color and discharge, no lesions              Cervix: no lesions and changes consistent with prior LEEP.              Pap taken: Yes.   Bimanual Exam:  Uterus:  normal size, contour, position, consistency, mobility, non-tender              Adnexa: normal adnexa and no mass, fullness, tenderness              Rectovaginal: Yes.  .  Confirms.              Anus:  normal sphincter tone, no lesions  Chaperone was present for exam.  Assessment:   Well woman visit with normal exam. Hx LEEP.  Last pap showing normal cells and positive HR HPV. FH of breast cancer in sister.  Bilateral breast augmentation.   Plan: Yearly mammogram recommended after age 17.  Recommended self breast exam.  Pap and HR HPV as above. Discussed Calcium, Vitamin D, regular exercise program including cardiovascular and weight bearing exercise. Labs performed.  No..   See orders. Refills given on medications.  No..  See orders. Discussed cooking oils for vaginal lubricant. Follow up annually and prn.      After visit summary provided.

## 2015-01-01 NOTE — Patient Instructions (Signed)

## 2015-01-05 LAB — IPS PAP TEST WITH HPV

## 2015-02-16 ENCOUNTER — Other Ambulatory Visit: Payer: Self-pay | Admitting: Cardiology

## 2015-03-29 ENCOUNTER — Ambulatory Visit: Payer: PRIVATE HEALTH INSURANCE | Admitting: Cardiology

## 2015-03-29 ENCOUNTER — Other Ambulatory Visit: Payer: PRIVATE HEALTH INSURANCE

## 2015-03-29 ENCOUNTER — Encounter: Payer: Self-pay | Admitting: Cardiology

## 2015-03-29 ENCOUNTER — Ambulatory Visit (INDEPENDENT_AMBULATORY_CARE_PROVIDER_SITE_OTHER): Payer: PRIVATE HEALTH INSURANCE | Admitting: Cardiology

## 2015-03-29 VITALS — BP 110/62 | HR 63 | Ht 67.0 in | Wt 158.8 lb

## 2015-03-29 DIAGNOSIS — E7800 Pure hypercholesterolemia, unspecified: Secondary | ICD-10-CM

## 2015-03-29 DIAGNOSIS — I493 Ventricular premature depolarization: Secondary | ICD-10-CM | POA: Diagnosis not present

## 2015-03-29 DIAGNOSIS — R002 Palpitations: Secondary | ICD-10-CM

## 2015-03-29 NOTE — Progress Notes (Signed)
Cardiology Office Note   Date:  03/29/2015   ID:  Virginia Tate, DOB 03/15/1956, MRN [id number]  PCP:  Lottie Dawson, MD  Cardiologist: Darlin Coco MD  Chief Complaint  Patient presents with  . Mitral Valve Prolapse      History of Present Illness: Virginia Tate is a 59 y.o. female who presents for 6 month follow-up office visit  This 59 year old woman has a past history of suspected mild mitral valve prolapse. An echocardiogram in 2008 which showed a small degree of mitral valve prolapse and trace mitral regurgitation. She has a past history of PVCs which have improved with avoidance of caffeine with regular aerobic exercise. She has a past history of hyperlipidemia but because of favorable LDL particle size and low insulin resistance score she has not had to go on any statin therapy.   The patient stays physically active. She works out with a Clinical research associate several times a week. She does Pilates. She plays golf. She no longer plays tennis. 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 She does a lot of flying to other cities to help clients. Because of her increased palpitations she underwent an 2-D echocardiogram on 07/29/14 which showed normal left ventricular systolic function with ejection fraction 60-65%. There was trivial mitral regurgitation.. 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 She had a 48-hour Holter monitor which did not show any malignant arrhythmias. She is in normal sinus rhythm. There was no atrial fibrillation. She had scattered premature atrial beats and premature ventricular beats. Some of the premature beats were interpolated. The patient previously had been on nadolol which seemed no longer to be effective. We switched her to metoprolol succinate. She initially was on 25 mg daily and we have increased it to 50 mg daily. On this regimen she is doing fairly well with just  occasional palpitations which do not bother her much.  She never notes palpitations with exercise. No chest pain. No dizziness or syncope.  She has been more fatigue since switching to Toprol.  Instead of taking 50 mg every morning we are going to have her switch to 25 mg twice a day and see if her fatigue improves.  Past Medical History  Diagnosis Date  . Palpitations   . PVC's (premature ventricular contractions)   . Hyperlipidemia     has favorable LDL particle size and low insulin resistance score.  Marland Kitchen LGSIL (low grade squamous intraepithelial dysplasia)   . MVP (mitral valve prolapse)     No antibiotics required  . Bartholin's gland abscess   . Heart murmur     Past Surgical History  Procedure Laterality Date  . US echocardiography  09/03/2006    EF 55-60%  . Cervical biopsy  w/ loop electrode excision  2006?    Had pap q 3 months all normal   . Colposcopy    . Cesarean section    . Tonsillectomy and adenoidectomy    . Appendectomy    . Augmentation mammaplasty       Current Outpatient Prescriptions  Medication Sig Dispense Refill  . aspirin 81 MG tablet Take 81 mg by mouth daily.      . Calcium Carbonate-Vitamin D (CALCIUM 600 + D PO) Take 650 mg by mouth daily.     . cholecalciferol (VITAMIN D) 1000 UNITS tablet Take 1,000 Units by mouth daily.      . Coenzyme Q10 (COQ10 PO) Take 200  mg by mouth daily.     . Cyanocobalamin (VITAMIN B 12 PO) Take 1,000 mcg by mouth daily.    . Magnesium 250 MG TABS Take 250 mg by mouth daily.    . metoprolol succinate (TOPROL-XL) 50 MG 24 hr tablet Take 50 mg by mouth as directed. 1/2 tablet twice a day Take with or immediately following a meal.    . Omega-3 Fatty Acids (OMEGA-3 FISH OIL) 1200 MG CAPS Take 1 capsule by mouth daily.    . Probiotic Product (SOLUBLE FIBER/PROBIOTICS PO) Take 1 capsule by mouth daily.      No current facility-administered medications for this visit.    Allergies:   Review of patient's allergies indicates  no known allergies.    Social History:  The patient  reports that she has never smoked. She does not have any smokeless tobacco history on file. She reports that she drinks about 1.2 oz of alcohol per week. She reports that she does not use illicit drugs.   Family History:  The patient's family history includes Breast cancer in her paternal grandmother and sister; Dementia in her mother; Diabetes in her sister; Heart failure in her father; Hypertension in her brother, father, and mother.    ROS:  Please see the history of present illness.   Otherwise, review of systems are positive for none.   All other systems are reviewed and negative.    PHYSICAL EXAM: VS:  BP 110/62 mmHg  Pulse 63  Ht 5\' 7"  (1.702 m)  Wt 158 lb 12.8 oz (72.031 kg)  BMI 24.87 kg/m2 , BMI Body mass index is 24.87 kg/(m^2). GEN: Well nourished, well developed, in no acute distress HEENT: normal Neck: no JVD, carotid bruits, or masses Cardiac: RRR; no murmurs, rubs, or gallops,no edema  Respiratory:  clear to auscultation bilaterally, normal work of breathing GI: soft, nontender, nondistended, + BS MS: no deformity or atrophy Skin: warm and dry, no rash Neuro:  Strength and sensation are intact Psych: euthymic mood, full affect   EKG:  EKG is not ordered today.    Recent Labs: 07/24/2014: Magnesium 2.1 09/21/2014: BUN 20; Creatinine, Ser 0.82; Potassium 3.6; Sodium 139    Lipid Panel    Component Value Date/Time   CHOL 239* 02/20/2014 1535   TRIG 59.0 02/20/2014 1535   HDL 62.70 02/20/2014 1535   CHOLHDL 4 02/20/2014 1535   VLDL 11.8 02/20/2014 1535   LDLCALC 165* 02/20/2014 1535   LDLDIRECT 154.1 02/28/2013 0809      Wt Readings from Last 3 Encounters:  03/29/15 158 lb 12.8 oz (72.031 kg)  01/01/15 159 lb 3.2 oz (72.213 kg)  10/22/14 159 lb 1.9 oz (72.176 kg)         ASSESSMENT AND PLAN:  1. Minimal mitral valve prolapse and minimal mitral regurgitation. Good LV systolic function. 2.  Sporadic PACs and PVCs on Holter monitor, no evidence of ventricular tachycardia or of atrial fibrillation. 3. Hypercholesterolemia  Disposition: Continue current medication. We are checking fasting labs today.  Recheck in 6 months for office visit and EKG.  Change Toprol to 25 mg twice a day.   Current medicines are reviewed at length with the patient today.  The patient does not have concerns regarding medicines.  The following changes have been made:  no change  Labs/ tests ordered today include:   Orders Placed This Encounter  Procedures  . Lipid panel  . Hepatic function panel  . Basic metabolic panel     Disposition:  Following my retirement she will discuss with her husband who is a physician whom she would like to see in follow-up and let us know.  Berna Spare MD 03/29/2015 1:11 PM    [address], [place],   [address] Phone: [phone]; Fax: [phone]

## 2015-03-29 NOTE — Patient Instructions (Addendum)
Medication Instructions:  CHANGE YOUR TOPROL (METOPROLOL) TO 1/2 TABLET TWICE A DAY   Labwork: LP/BMET/HFP  Testing/Procedures: none  Follow-Up: Your physician recommends that you schedule a follow-up appointment in: 6 month ov/ekg with Tera Helper NP or Brynda Rim PA   If you need a refill on your cardiac medications before your next appointment, please call your pharmacy.

## 2015-03-31 ENCOUNTER — Other Ambulatory Visit (INDEPENDENT_AMBULATORY_CARE_PROVIDER_SITE_OTHER): Payer: PRIVATE HEALTH INSURANCE | Admitting: *Deleted

## 2015-03-31 DIAGNOSIS — R002 Palpitations: Secondary | ICD-10-CM

## 2015-03-31 DIAGNOSIS — I341 Nonrheumatic mitral (valve) prolapse: Secondary | ICD-10-CM | POA: Diagnosis not present

## 2015-03-31 DIAGNOSIS — E785 Hyperlipidemia, unspecified: Secondary | ICD-10-CM

## 2015-03-31 LAB — HEPATIC FUNCTION PANEL
ALT: 17 U/L (ref 6–29)
AST: 17 U/L (ref 10–35)
Albumin: 4 g/dL (ref 3.6–5.1)
Alkaline Phosphatase: 58 U/L (ref 33–130)
BILIRUBIN DIRECT: 0.1 mg/dL (ref <=0.2)
BILIRUBIN TOTAL: 0.5 mg/dL (ref 0.2–1.2)
Indirect Bilirubin: 0.4 mg/dL (ref 0.2–1.2)
Total Protein: 6.3 g/dL (ref 6.1–8.1)

## 2015-03-31 LAB — TSH: TSH: 1.979 u[IU]/mL (ref 0.350–4.500)

## 2015-03-31 LAB — LIPID PANEL
CHOL/HDL RATIO: 3.1 ratio (ref <=5.0)
Cholesterol: 228 mg/dL — ABNORMAL HIGH (ref 125–200)
HDL: 74 mg/dL (ref >=46)
LDL Cholesterol: 140 mg/dL — ABNORMAL HIGH (ref <130)
Triglycerides: 68 mg/dL (ref <150)
VLDL: 14 mg/dL (ref <30)

## 2015-03-31 NOTE — Progress Notes (Signed)
Quick Note:  Please report to patient. The recent labs are stable. Continue same medication and careful diet. The thyroid is normal. The cholesterol and LDL are improved. HDL is improved. Risk ratio has improved down to 3.1. Liver tests are normal. Continue same medication. ______

## 2015-03-31 NOTE — Addendum Note (Signed)
Addended by: Eulis Foster on: 03/31/2015 07:59 AM   Modules accepted: Orders

## 2015-04-02 ENCOUNTER — Telehealth: Payer: Self-pay | Admitting: Cardiology

## 2015-04-02 NOTE — Telephone Encounter (Signed)
Advised patient of lab results  

## 2015-04-02 NOTE — Telephone Encounter (Signed)
F/u  Pt returning Rn phone call- please call back and discuss.

## 2015-04-02 NOTE — Telephone Encounter (Signed)
-----   Message from Darlin Coco, MD sent at 03/31/2015  7:02 PM EST ----- Please report to patient.  The recent labs are stable. Continue same medication and careful diet. The thyroid is normal. The cholesterol and LDL are improved. HDL is improved. Risk ratio has improved down to 3.1. Liver tests are normal. Continue same medication.

## 2015-04-06 MED ORDER — METOPROLOL SUCCINATE ER 25 MG PO TB24: 25.0000 mg | ORAL_TABLET | Freq: Two times a day (BID) | ORAL | Status: DC

## 2015-07-08 IMAGING — CR DG CHEST 2V
2 series · 2 of 2 positions shown · non-contrast
Comparison: 09/03/2006

CLINICAL DATA: Chronic chest palpitations for 25 years, worse
within the last 3 weeks.

EXAM:
CHEST  2 VIEW

[view not recorded (1 of 2)]
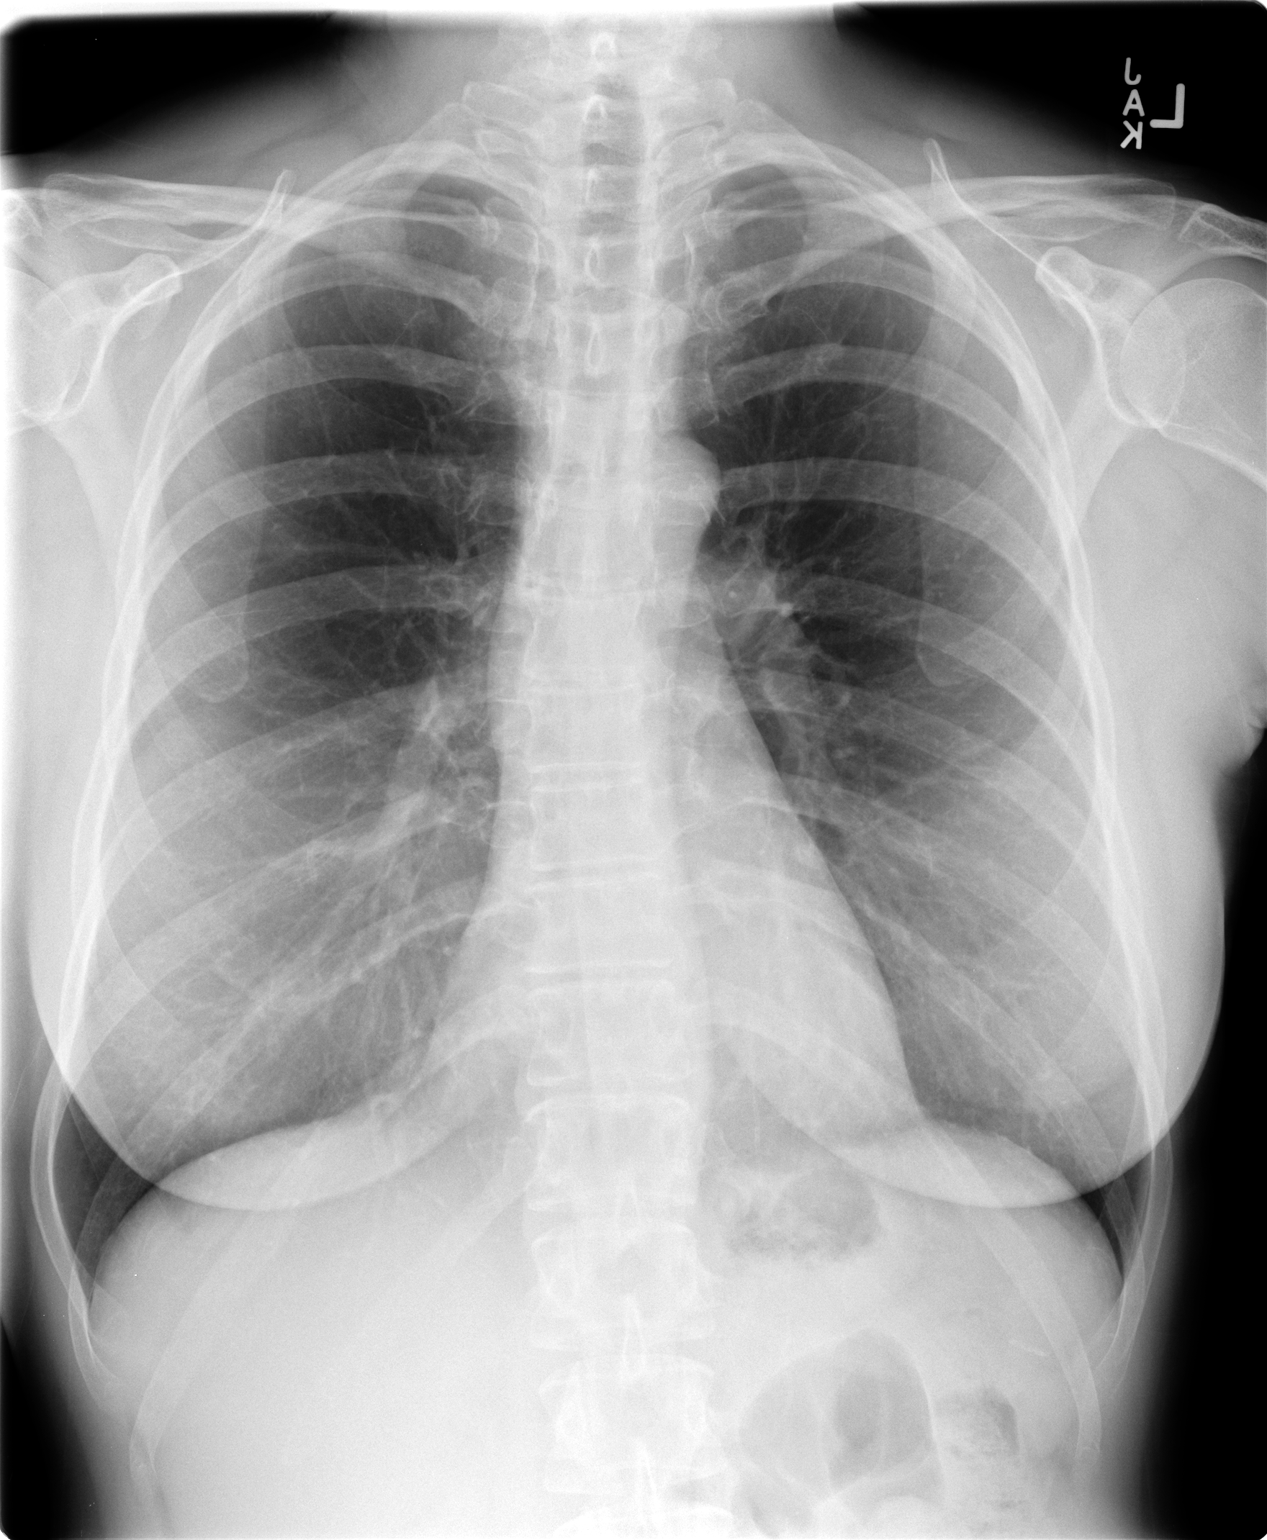

[view not recorded (2 of 2)]
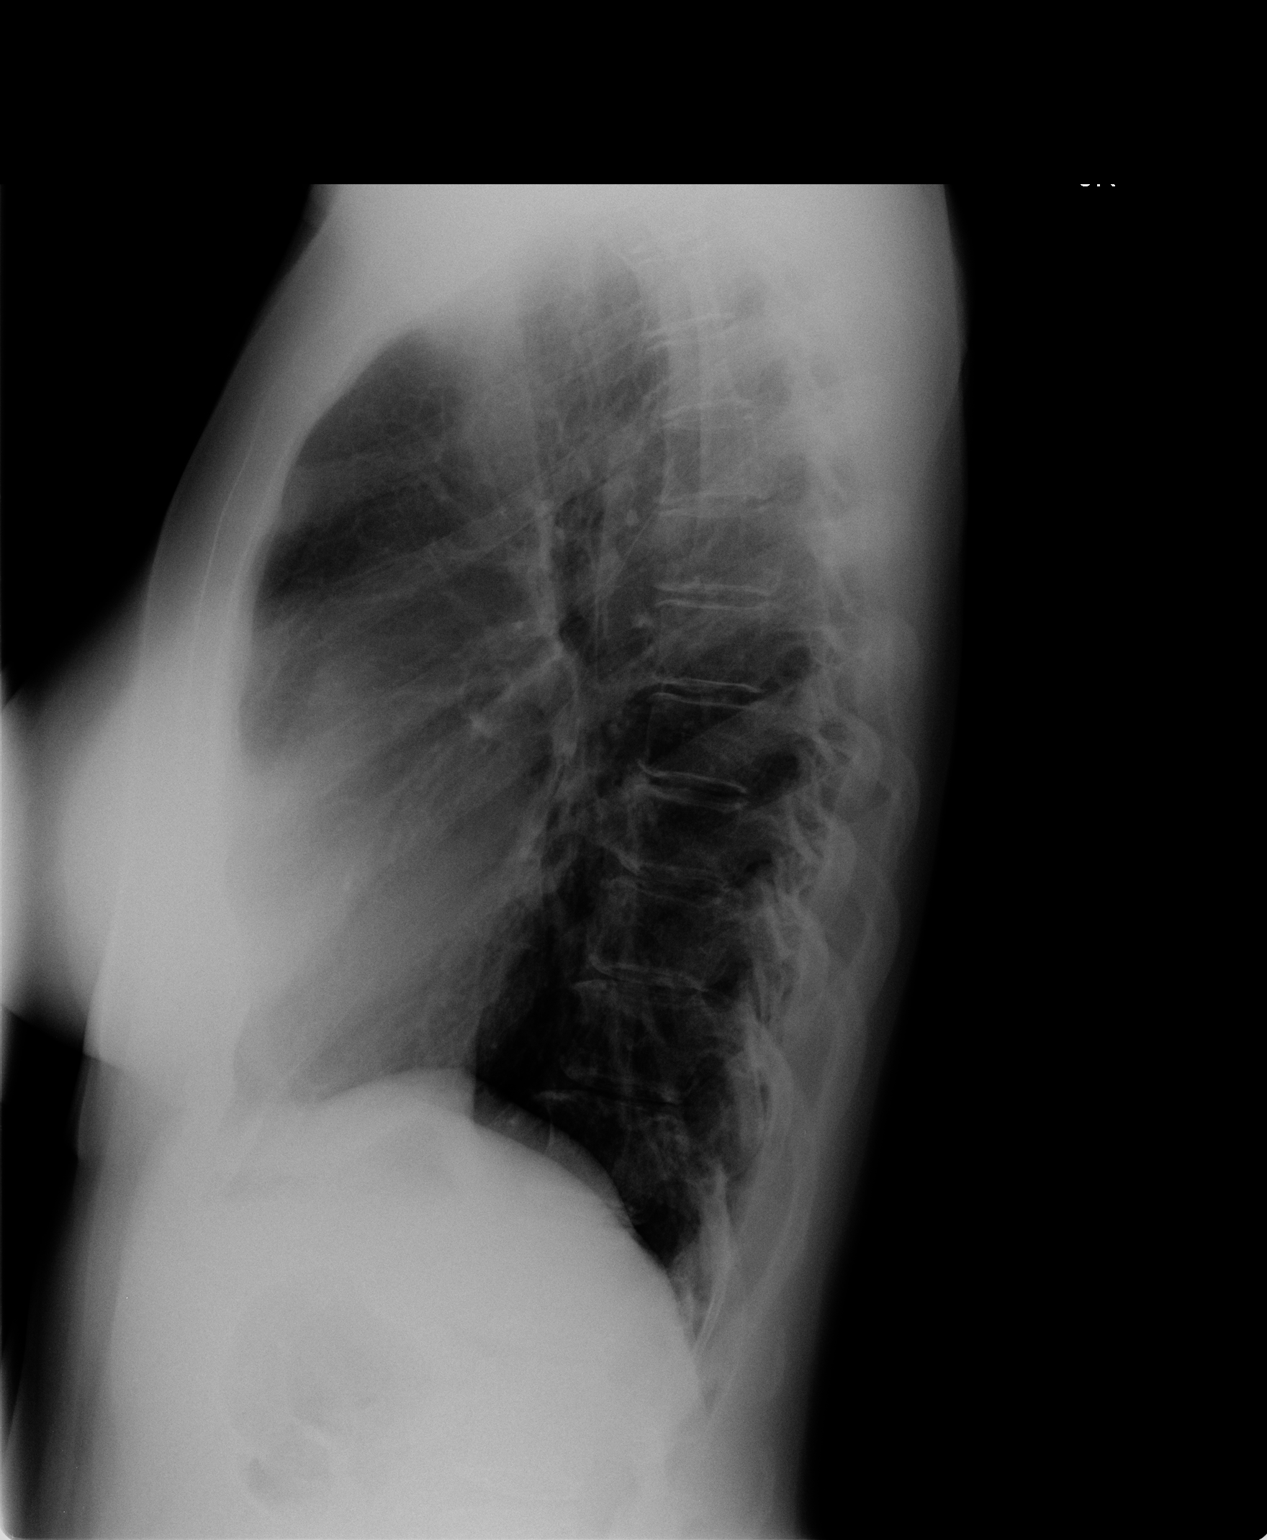

[2 of 2 positions shown; findings below may reference images not displayed]

FINDINGS: Grossly unchanged cardiac silhouette and mediastinal contours.
Veiling opacities overlying the bilateral lower secondary to
bilateral breast augmentation. No focal parenchymal opacities. No
pleural effusion or pneumothorax. No evidence of edema. No acute
osseus abnormalities.
IMPRESSION: No acute cardiopulmonary disease.

## 2015-09-09 DIAGNOSIS — H02422 Myogenic ptosis of left eyelid: Secondary | ICD-10-CM | POA: Insufficient documentation

## 2015-09-09 DIAGNOSIS — H5340 Unspecified visual field defects: Secondary | ICD-10-CM | POA: Insufficient documentation

## 2015-09-24 ENCOUNTER — Other Ambulatory Visit: Payer: Self-pay

## 2015-09-24 DIAGNOSIS — Z1231 Encounter for screening mammogram for malignant neoplasm of breast: Secondary | ICD-10-CM

## 2015-09-24 DIAGNOSIS — Z9882 Breast implant status: Secondary | ICD-10-CM

## 2015-09-28 ENCOUNTER — Ambulatory Visit: Payer: PRIVATE HEALTH INSURANCE | Admitting: Nurse Practitioner

## 2015-09-28 ENCOUNTER — Encounter: Payer: Self-pay | Admitting: Interventional Cardiology

## 2015-09-28 ENCOUNTER — Ambulatory Visit (INDEPENDENT_AMBULATORY_CARE_PROVIDER_SITE_OTHER): Payer: PRIVATE HEALTH INSURANCE | Admitting: Interventional Cardiology

## 2015-09-28 VITALS — BP 100/80 | HR 68 | Ht 67.0 in | Wt 155.1 lb

## 2015-09-28 DIAGNOSIS — R002 Palpitations: Secondary | ICD-10-CM

## 2015-09-28 DIAGNOSIS — E7800 Pure hypercholesterolemia, unspecified: Secondary | ICD-10-CM

## 2015-09-28 DIAGNOSIS — I341 Nonrheumatic mitral (valve) prolapse: Secondary | ICD-10-CM

## 2015-09-28 DIAGNOSIS — E785 Hyperlipidemia, unspecified: Secondary | ICD-10-CM

## 2015-09-28 NOTE — Progress Notes (Signed)
Patient ID: Virginia Tate, female   DOB: [birth year], 61 y.o.   MRN: [id number]     Cardiology Office Note   Date:  09/28/2015   ID:  Virginia Tate, DOB [birth year], MRN [id number]  PCP:  Kelton Pillar, MD    No chief complaint on file. palpitations   Wt Readings from Last 3 Encounters:  09/28/15 155 lb 1.9 oz (70.362 kg)  03/29/15 158 lb 12.8 oz (72.031 kg)  01/01/15 159 lb 3.2 oz (72.213 kg)       History of Present Illness: Virginia Tate is a 60 y.o. female  has a past history of mild mitral valve prolapse. She was seen by Dr. Mare Ferrari for many years. An echocardiogram in 2008 which showed a small degree of mitral valve prolapse and trace mitral regurgitation. She has a past history of PVCs which have improved with avoidance of caffeine with regular aerobic exercise. She has a past history of hyperlipidemia but because of favorable LDL particle size and low insulin resistance score she has not had to go on any statin therapy.   The patient stays physically active. She works out with a Clinical research associate several times a week. She does Pilates. She plays golf. She no longer plays tennis. 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 She does a lot of flying to other cities to help clients. Because of her increased palpitations she underwent an 2-D echocardiogram on 07/29/14 which showed normal left ventricular systolic function with ejection fraction 60-65%. There was trivial mitral regurgitation.. 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 She had a 48-hour Holter monitor which did not show any malignant arrhythmias.  She is in normal sinus rhythm. There was no atrial fibrillation. She had scattered premature atrial beats and premature ventricular beats. Some of the premature beats were interpolated. The patient previously had been on nadolol which lost efficacy. We switched her to metoprolol succinate. She initially  was on 25 mg daily and we have increased it to 50 mg daily.   On this regimen she is doing fairly well with just occasional palpitations which do not bother her much. She never notes palpitations with exercise. No chest pain. No dizziness or syncope. She has been more fatigued since switching to Toprol. Instead of taking 50 mg every morning, she was switched to 25 mg twice a day.  Lipids were elevated in 5/17 at [facility].   Past Medical History  Diagnosis Date  . Palpitations   . PVC's (premature ventricular contractions)   . Hyperlipidemia     has favorable LDL particle size and low insulin resistance score.  Marland Kitchen LGSIL (low grade squamous intraepithelial dysplasia)   . MVP (mitral valve prolapse)     No antibiotics required  . Bartholin's gland abscess   . Heart murmur     Past Surgical History  Procedure Laterality Date  . US echocardiography  09/03/2006    EF 55-60%  . Cervical biopsy  w/ loop electrode excision  2006?    Had pap q 3 months all normal   . Colposcopy    . Cesarean section    . Tonsillectomy and adenoidectomy    . Appendectomy    . Augmentation mammaplasty       Current Outpatient Prescriptions  Medication Sig Dispense Refill  . aspirin 81 MG tablet Take 81 mg by mouth daily.      . Calcium Carbonate-Vitamin D (CALCIUM 600 + D PO) Take  650 mg by mouth daily. ADORA    . cholecalciferol (VITAMIN D) 1000 UNITS tablet Take 1,000 Units by mouth daily.      . Coenzyme Q10 (COQ10 PO) Take 200 mg by mouth daily.     . Cyanocobalamin (VITAMIN B 12 PO) Take 1,000 mcg by mouth daily.    . Magnesium 250 MG TABS Take 250 mg by mouth daily.    . metoprolol succinate (TOPROL-XL) 25 MG 24 hr tablet Take 1 tablet (25 mg total) by mouth 2 (two) times daily. 180 tablet 3  . Omega-3 Fatty Acids (OMEGA-3 FISH OIL) 1200 MG CAPS Take 1 capsule by mouth daily.    . Probiotic Product (SOLUBLE FIBER/PROBIOTICS PO) Take 1 capsule by mouth daily.      No current  facility-administered medications for this visit.    Allergies:   Review of patient's allergies indicates no known allergies.    Social History:  The patient  reports that she has never smoked. She does not have any smokeless tobacco history on file. She reports that she drinks about 1.2 oz of alcohol per week. She reports that she does not use illicit drugs.   Family History:  The patient's family history includes Breast cancer in her paternal grandmother and sister; Dementia in her mother; Diabetes in her sister; Heart attack in her maternal grandfather; Heart failure in her father; Hypertension in her brother, father, and mother.    ROS:  Please see the history of present illness.   Otherwise, review of systems are positive for decreased palpitations.   All other systems are reviewed and negative.    PHYSICAL EXAM: VS:  BP 100/80 mmHg  Pulse 68  Ht 5\' 7"  (1.702 m)  Wt 155 lb 1.9 oz (70.362 kg)  BMI 24.29 kg/m2 , BMI Body mass index is 24.29 kg/(m^2). GEN: Well nourished, well developed, in no acute distress HEENT: normal Neck: no JVD, carotid bruits, or masses Cardiac: RRR; no murmurs, rubs, or gallops,no edema  Respiratory:  clear to auscultation bilaterally, normal work of breathing GI: soft, nontender, nondistended, + BS MS: no deformity or atrophy Skin: warm and dry, no rash Neuro:  Strength and sensation are intact Psych: euthymic mood, full affect   EKG:   The ekg ordered today demonstrates sinus bradycardia, NSST   Recent Labs: 03/31/2015: ALT 17; TSH 1.979   Lipid Panel    Component Value Date/Time   CHOL 228* 03/31/2015 0759   TRIG 68 03/31/2015 0759   HDL 74 03/31/2015 0759   CHOLHDL 3.1 03/31/2015 0759   VLDL 14 03/31/2015 0759   LDLCALC 140* 03/31/2015 0759   LDLDIRECT 154.1 02/28/2013 0809     Other studies Reviewed: Additional studies/ records that were reviewed today with results demonstrating: lipids reviewed.   ASSESSMENT AND PLAN:  1.    Minimal mitral valve prolapse and minimal mitral regurgitation. Normal LV systolic function by last echo. 2. Sporadic PACs and PVCs on Holter monitor, no evidence of ventricular tachycardia or of atrial fibrillation.  Nadolol stopped working when it was made by a different company.  Metoprolol 25 mg BID is working well.  No fatigue Minimizes caffeine. 3. Hypercholesterolemia  : LDL 168 in 5/17 at [facility]. She exercises regularly and eats very healthy. She would like to avoid statins if possible. Will recheck lipids in about 2 months. The above result was nonfasting. If she did require statin, she may only need a pill a few times a week.   Current medicines are reviewed  at length with the patient today.  The patient concerns regarding her medicines were addressed.  The following changes have been made:  No change  Labs/ tests ordered today include: lipids in 2 months  Orders Placed This Encounter  Procedures  . EKG 12-Lead    Recommend 150 minutes/week of aerobic exercise Low fat, low carb, high fiber diet recommended  Disposition:   FU in 1 year   Signed, Larae Grooms, MD  09/28/2015 1:54 PM    [facility] [address], [place], [place]  [address] Phone: [phone]; Fax: [phone]

## 2015-09-28 NOTE — Patient Instructions (Signed)
**Note De-Identified Virginia Tate Obfuscation** Medication Instructions:  Same-no changes  Labwork: Lipids on 7/27-Please do not eat or drink afte midnight the night before labs are drawn.  Testing/Procedures: None  Follow-Up: Your physician wants you to follow-up in: 1 year. You will receive a reminder letter in the mail two months in advance. If you don't receive a letter, please call our office to schedule the follow-up appointment..     If you need a refill on your cardiac medications before your next appointment, please call your pharmacy.

## 2015-10-14 HISTORY — PX: BLEPHAROPLASTY: SUR158

## 2015-10-22 DIAGNOSIS — B029 Zoster without complications: Secondary | ICD-10-CM

## 2015-10-22 HISTORY — DX: Zoster without complications: B02.9

## 2015-11-01 ENCOUNTER — Ambulatory Visit
Admission: RE | Admit: 2015-11-01 | Discharge: 2015-11-01 | Disposition: A | Discharge status: Home / Self Care | Payer: PRIVATE HEALTH INSURANCE | Source: Ambulatory Visit

## 2015-11-01 DIAGNOSIS — Z9882 Breast implant status: Secondary | ICD-10-CM

## 2015-11-01 DIAGNOSIS — Z1231 Encounter for screening mammogram for malignant neoplasm of breast: Secondary | ICD-10-CM

## 2015-11-04 ENCOUNTER — Telehealth: Payer: Self-pay | Admitting: Emergency Medicine

## 2015-11-04 NOTE — Telephone Encounter (Signed)
-----   Message from Nunzio Cobbs, MD sent at 11/03/2015  8:16 AM EDT ----- Results to patient through My Chart.  Please place in mammogram hold and contact the patient regarding potential MRI of the breast.  Her husband is a radiologist, and I am certain that he will want to contact his colleagues regarding this result.  "Hello Ms. Volland,   Your mammogram has returned suggesting possible breast implant rupture on both sides.  There was not sign of cancer.  These ruptures were not seen on the several previous mammogram reports I reviewed.  MRI of the breast may be the next step for you.   I am sure you will want to discuss this with your husband.   I will have the office call you in follow up to this message.   Thank you,  Josefa Half, MD"

## 2015-11-04 NOTE — Telephone Encounter (Signed)
Call to patient and discussed message from Dr. Quincy Simmonds with patient.   She states that her husband Dr. Rozetta Nunnery discussed results with Dr. Melanee Spry who read mammogram. She states they discussed that the possible rupture is not the implant itself but possible scar tissue around the implant. Patient reports she had a very painful mammogram last year and thinks this may be cause for the change. She states that her husband did not think MRI was necessary, but states that Dr. Quincy Simmonds is her physician and she will have MRI if Dr. Quincy Simmonds recommends it.  She asked if Dr. Quincy Simmonds wouldn't mind discussing this with her husband who is available today via cell phone at [phone], also is authorized on designated party release form.  Advised that Dr. Quincy Simmonds may send message back through nurse but that our office contact her back with Dr. Elza Rafter recommendation. Patient is agreeable to plan.

## 2015-11-04 NOTE — Telephone Encounter (Signed)
Phone call to Dr. Zigmund Daniel regarding his wife's mammogram showing possible bilateral intracapsular rupture of implants.  No sign of malignancy was seen. He has spoken with Dr. Melanee Spry, but not reviewed the films himself. Her plastic surgeon was Dr. Dessie Coma, who is now retired.   I am suggesting consultation with a plastic surgeon who can render an opinion regarding the implants and need for intervention versus observational management. Dr. Zigmund Daniel will reach out to Dr. Dessie Coma for an opinion who his wife should now see for care for this issue.  I have also placed a call to Dr. Melanee Spry regarding the mammogram result.   Dr. Zigmund Daniel or his wife will report back regarding their desires for the next step in her care.

## 2015-11-05 NOTE — Telephone Encounter (Signed)
Noted message from Dr. Silva.  Will close encounter.   

## 2015-11-10 ENCOUNTER — Telehealth: Payer: Self-pay | Admitting: Obstetrics and Gynecology

## 2015-11-10 NOTE — Telephone Encounter (Signed)
Encounter closed in error.

## 2015-11-10 NOTE — Telephone Encounter (Signed)
Spoke with patient. Patient reports she was seen with her gastroenterologist for lower abdominal discomfort. Had hemoccult testing which was negative. Denies bowel changes, nausea, or vomiting. GI recommended that the patient be seen by Dr.Silva for evaluation. Reports discomfort is intermittent and feels like a "twingy."  Abdominal discomfort occurs intermittently. Reports she will go days without discomfort and then discomfort will occur multiple times in a day. Discomfort lasts for a couple of seconds each time. Denies any correlation with movement. Advised she will need to be seen in the office for further evaluation. She is agreeable. Appointment scheduled for 11/12/2015 at 8 am with Dr.Silva. She is agreeable to date and time. Advised if discomfort increases in frequency or severity she will need to be seen in the office sooner. Patient is agreeable.  Routing to provider for final review. Patient agreeable to disposition. Will close encounter.

## 2015-11-10 NOTE — Telephone Encounter (Signed)
Patient is having some lower side pain and went to her PCP and was told to see her GYN.

## 2015-11-12 ENCOUNTER — Encounter: Payer: Self-pay | Admitting: Obstetrics and Gynecology

## 2015-11-12 ENCOUNTER — Ambulatory Visit (INDEPENDENT_AMBULATORY_CARE_PROVIDER_SITE_OTHER): Payer: PRIVATE HEALTH INSURANCE | Admitting: Obstetrics and Gynecology

## 2015-11-12 VITALS — BP 100/62 | HR 60 | Ht 66.0 in | Wt 158.0 lb

## 2015-11-12 DIAGNOSIS — R1032 Left lower quadrant pain: Secondary | ICD-10-CM

## 2015-11-12 DIAGNOSIS — Z9882 Breast implant status: Secondary | ICD-10-CM | POA: Diagnosis not present

## 2015-11-12 LAB — POCT URINALYSIS DIPSTICK
BILIRUBIN UA: NEGATIVE
Blood, UA: NEGATIVE
Glucose, UA: NEGATIVE
Ketones, UA: NEGATIVE
Leukocytes, UA: NEGATIVE
Nitrite, UA: NEGATIVE
PH UA: 5
PROTEIN UA: NEGATIVE
Urobilinogen, UA: NEGATIVE

## 2015-11-12 NOTE — Progress Notes (Signed)
Patient ID: Virginia Tate, female   DOB: 02-14-1956, 60 y.o.   MRN: [id number] GYNECOLOGY  VISIT   HPI: 60 y.o.   Married  Caucasian  female   G2P2003 with Patient's last menstrual period was 05/15/2005 (approximate).   here for left lower quadrant discomfort for 2-3 months.Pain is intermittent.  No nausea, no bowel changes, no fever, no vaginal bleeding. Patient did see her GI and had negative work-up;negative IFOB.    Did not have colonoscopy.   Intermittent pain.  Comes and goes up to every 2 weeks.  Nothing makes it better or worse.  When pain happens, she wants to push on it.   Denies dysuria, urgency, or frequency.   No vaginal discharge, itching or odor.   Has increased her working out.   Hx Cesarean section.  Has an appointment with Dr. Dessie Coma to re-evaluate her implants which may have bilateral intracapsular rupture.  Patient's husband has also reviewed her mammogram with the head of radiology.  Everyone feels that there is no sign of malignancy.   Urine dip is negative.  GYNECOLOGIC HISTORY: Patient's last menstrual period was 05/15/2005 (approximate). Contraception:  Postmenopausal Menopausal hormone therapy:  none Last mammogram:  [id number] Silicone Implants/density B/possible bil.rupture/Neg/BiRads1/screening 1yr:[facility] Last pap smear:   01-01-15 Neg:Neg HR HPV        OB History    Gravida Para Term Preterm AB TAB SAB Ectopic Multiple Living   2 2 2      1 3          Patient Active Problem List   Diagnosis Date Noted  . LGSIL (low grade squamous intraepithelial dysplasia)   . MVP (mitral valve prolapse) 02/21/2011  . Hyperlipidemia 02/21/2011  . Palpitation 02/21/2011    Past Medical History  Diagnosis Date  . Palpitations   . PVC's (premature ventricular contractions)   . Hyperlipidemia     has favorable LDL particle size and low insulin resistance score.  Marland Kitchen LGSIL (low grade squamous intraepithelial dysplasia)   . MVP (mitral valve  prolapse)     No antibiotics required  . Bartholin's gland abscess   . Heart murmur   . Shingles 10-22-15    torso    Past Surgical History  Procedure Laterality Date  . US echocardiography  09/03/2006    EF 55-60%  . Cervical biopsy  w/ loop electrode excision  2006?    Had pap q 3 months all normal   . Colposcopy    . Cesarean section    . Tonsillectomy and adenoidectomy    . Appendectomy    . Augmentation mammaplasty      Current Outpatient Prescriptions  Medication Sig Dispense Refill  . aspirin 81 MG tablet Take 81 mg by mouth daily.      . Calcium Carbonate-Vitamin D (CALCIUM 600 + D PO) Take 650 mg by mouth daily. ADORA    . cholecalciferol (VITAMIN D) 1000 UNITS tablet Take 1,000 Units by mouth daily.      . Coenzyme Q10 (COQ10 PO) Take 200 mg by mouth daily.     . Cyanocobalamin (VITAMIN B 12 PO) Take 1,000 mcg by mouth daily.    . Magnesium 250 MG TABS Take 250 mg by mouth daily.    . metoprolol succinate (TOPROL-XL) 25 MG 24 hr tablet Take 1 tablet (25 mg total) by mouth 2 (two) times daily. 180 tablet 3  . Omega-3 Fatty Acids (OMEGA-3 FISH OIL) 1200 MG CAPS Take 1 capsule by mouth daily.    Marland Kitchen  Probiotic Product (SOLUBLE FIBER/PROBIOTICS PO) Take 1 capsule by mouth daily.      No current facility-administered medications for this visit.     ALLERGIES: Review of patient's allergies indicates no known allergies.  Family History  Problem Relation Age of Onset  . Heart failure Father   . Hypertension Father   . Hypertension Mother   . Diabetes Sister   . Breast cancer Sister     Age 23  . Hypertension Brother   . Breast cancer Paternal Grandmother     Age 58's  . Dementia Mother   . Heart attack Maternal Grandfather     Social History   Social History  . Marital Status: Married    Spouse Name: N/A  . Number of Children: N/A  . Years of Education: N/A   Occupational History  . Not on file.   Social History Main Topics  . Smoking status: Never Smoker    . Smokeless tobacco: Not on file  . Alcohol Use: 1.2 oz/week    2 Standard drinks or equivalent per week     Comment: socially  . Drug Use: No  . Sexual Activity: Yes    Birth Control/ Protection: Post-menopausal   Other Topics Concern  . Not on file   Social History Narrative    ROS:  Pertinent items are noted in HPI.  PHYSICAL EXAMINATION:    BP 100/62 mmHg  Pulse 60  Ht 5\' 6"  (1.676 m)  Wt 158 lb (71.668 kg)  BMI 25.51 kg/m2  LMP 05/15/2005 (Approximate)    General appearance: alert, cooperative and appears stated age   Pelvic: External genitalia:  no lesions              Urethra:  normal appearing urethra with no masses, tenderness or lesions              Bartholins and Skenes: normal                 Vagina: normal appearing vagina with normal color and discharge, no lesions              Cervix: no lesions              Bimanual Exam:  Uterus:  normal size, contour, position, consistency, mobility, non-tender              Adnexa: normal adnexa and no mass, fullness, tenderness              Rectal exam: Yes.  .  Confirms.              Anus:  normal sphincter tone, no lesions  Chaperone was present for exam.  ASSESSMENT  Breast implants.  Possible bilateral intracapsular rupture. LLQ pain.  Negative GI work up.  Negative urine dip. Hx of Cesarean Section.  PLAN  Discussion of mammogram findings.  She will see her plastic surgeon for further evaluation and discussion.  Discussed the benefits of pelvic ultrasound. This will not show scar tissue however. Return for pelvic ultrasound next week.  Annual exam in the fall.     An After Visit Summary was printed and given to the patient.  _25___ minutes face to face time of which over 50% was spent in counseling.

## 2015-11-18 ENCOUNTER — Ambulatory Visit (INDEPENDENT_AMBULATORY_CARE_PROVIDER_SITE_OTHER): Payer: PRIVATE HEALTH INSURANCE | Admitting: Obstetrics and Gynecology

## 2015-11-18 ENCOUNTER — Ambulatory Visit (INDEPENDENT_AMBULATORY_CARE_PROVIDER_SITE_OTHER): Payer: PRIVATE HEALTH INSURANCE

## 2015-11-18 ENCOUNTER — Encounter: Payer: Self-pay | Admitting: Obstetrics and Gynecology

## 2015-11-18 VITALS — BP 110/62 | HR 60 | Ht 66.0 in | Wt 156.0 lb

## 2015-11-18 DIAGNOSIS — R1032 Left lower quadrant pain: Secondary | ICD-10-CM

## 2015-11-18 NOTE — Progress Notes (Signed)
Subjective  60 y.o. G66P2003 Married Caucasian female here for pelvic ultrasound for LLQ pain.  Worried about ovarian cancer.  Has seen GI and had negative work up.  No urinary symptoms.  No vaginal bleeding.   Had an abdominal/pelvic CT scan 2016 to rule out kidney stones.  The study was normal.   Patient's last menstrual period was 05/15/2005 (approximate).  Objective  Pelvic ultrasound images and report reviewed with patient.  Uterus - normal with no masses. EMS - 1.61 mm. Ovaries - no masses. Free fluid - no      Assessment  LLQ discomfort.  Normal pelvic ultrasound.  Plan  Reassurance regarding pelvic ultrasound findings.  Return if symptoms persist or worsen. Discussed musculoskeletal etiologies for pelvic pain.  Has annual exam here and with PCP in the fall 2017.  _15__ minutes face to face time of which over 50% was spent in counseling.   After visit summary to patient.

## 2015-12-09 ENCOUNTER — Other Ambulatory Visit: Payer: PRIVATE HEALTH INSURANCE | Admitting: *Deleted

## 2015-12-09 DIAGNOSIS — E785 Hyperlipidemia, unspecified: Secondary | ICD-10-CM

## 2015-12-09 LAB — LIPID PANEL
CHOL/HDL RATIO: 2.6 ratio (ref <=5.0)
Cholesterol: 244 mg/dL — ABNORMAL HIGH (ref 125–200)
HDL: 95 mg/dL (ref >=46)
LDL CALC: 139 mg/dL — AB (ref <130)
Triglycerides: 50 mg/dL (ref <150)
VLDL: 10 mg/dL (ref <30)

## 2016-01-14 ENCOUNTER — Ambulatory Visit: Payer: PRIVATE HEALTH INSURANCE | Admitting: Obstetrics and Gynecology

## 2016-01-19 ENCOUNTER — Ambulatory Visit: Payer: PRIVATE HEALTH INSURANCE | Admitting: Obstetrics and Gynecology

## 2016-03-01 ENCOUNTER — Ambulatory Visit: Payer: PRIVATE HEALTH INSURANCE | Admitting: Obstetrics and Gynecology

## 2016-03-02 ENCOUNTER — Ambulatory Visit (INDEPENDENT_AMBULATORY_CARE_PROVIDER_SITE_OTHER): Payer: PRIVATE HEALTH INSURANCE | Admitting: Obstetrics and Gynecology

## 2016-03-02 ENCOUNTER — Encounter: Payer: Self-pay | Admitting: Obstetrics and Gynecology

## 2016-03-02 VITALS — BP 106/66 | HR 56 | Resp 16 | Ht 66.0 in | Wt 159.2 lb

## 2016-03-02 DIAGNOSIS — Z01419 Encounter for gynecological examination (general) (routine) without abnormal findings: Secondary | ICD-10-CM | POA: Diagnosis not present

## 2016-03-02 NOTE — Progress Notes (Signed)
60 y.o. G46P2003 Married Caucasian female here for annual exam.    Would like a pap and HR HPV today.   Had normal pelvic ultrasound done in July done for LLQ pain.   Saw plastic surgeon, Dr. Dessie Coma, and recommendation for following the possible bilateral implant rupture. Denies pain or palpable mass.   No change in bladder function or bowel function.  No vaginal bleeding.   Son just married.  Patient and husband went to [place].   PCP:  Emi Belfast, MD.  Also sees cardiology, Dr. Irish Lack.   Patient's last menstrual period was 05/15/2005 (approximate).           Sexually active: Yes.   female The current method of family planning is post menopausal status.    Exercising: Yes.    Cardio, weights and pilates at least 3 days/week Smoker:  no  Health Maintenance: Pap:  01-01-15 Neg:Neg HR HPV History of abnormal Pap:  Yes,  Hx of LEEP with Dr. Cherylann Banas 10 years ago, had pap q 3 months for a year all paps were normal  MMG:  [id number] Silicone Implants/Density B/Poss.Bil.implant rupture/BiRads1/screening 88yr:[facility] Colonoscopy: 02/24/2010 Normal f/u in 2021.  BMD:   09-09-09  Result: Normal TDaP:  2012 Gardasil:   N/A HIV:  Not certain if this was done.  Donated blood three years ago.  Hep C:  Will check with PCP.   Screening Labs:  Hb today: PCP or cardiologist, Urine today: unable to void   reports that she has never smoked. She has never used smokeless tobacco. She reports that she drinks about 1.2 oz of alcohol per week . She reports that she does not use drugs.  Past Medical History:  Diagnosis Date  . Bartholin's gland abscess   . Heart murmur   . Hyperlipidemia    has favorable LDL particle size and low insulin resistance score.  Marland Kitchen LGSIL (low grade squamous intraepithelial dysplasia)   . MVP (mitral valve prolapse)    No antibiotics required  . Palpitations   . PVC's (premature ventricular contractions)   . Shingles 10-22-15   torso    Past Surgical  History:  Procedure Laterality Date  . APPENDECTOMY    . AUGMENTATION MAMMAPLASTY    . BLEPHAROPLASTY Bilateral 10/2015   ---[facility]  . CERVICAL BIOPSY  W/ LOOP ELECTRODE EXCISION  2006?   Had pap q 3 months all normal   . CESAREAN SECTION    . COLPOSCOPY    . TONSILLECTOMY AND ADENOIDECTOMY    . US ECHOCARDIOGRAPHY  09/03/2006   EF 55-60%    Current Outpatient Prescriptions  Medication Sig Dispense Refill  . aspirin 81 MG tablet Take 81 mg by mouth daily.      . Calcium Carbonate-Vitamin D (CALCIUM 600 + D PO) Take 650 mg by mouth daily. ADORA    . cholecalciferol (VITAMIN D) 1000 UNITS tablet Take 1,000 Units by mouth daily.      . Coenzyme Q10 (COQ10 PO) Take 200 mg by mouth daily.     . Cyanocobalamin (VITAMIN B 12 PO) Take 1,000 mcg by mouth daily.    . Magnesium 250 MG TABS Take 250 mg by mouth daily.    . metoprolol succinate (TOPROL-XL) 25 MG 24 hr tablet Take 1 tablet (25 mg total) by mouth 2 (two) times daily. 180 tablet 3  . Omega-3 Fatty Acids (OMEGA-3 FISH OIL) 1200 MG CAPS Take 1 capsule by mouth daily.    . Probiotic Product (SOLUBLE FIBER/PROBIOTICS PO)  Take 1 capsule by mouth daily.      No current facility-administered medications for this visit.     Family History  Problem Relation Age of Onset  . Heart failure Father   . Hypertension Father   . Hypertension Mother   . Dementia Mother   . Diabetes Sister   . Breast cancer Sister     Age 68  . Hypertension Brother   . Breast cancer Paternal Grandmother     Age 45's  . Heart attack Maternal Grandfather     ROS:  Pertinent items are noted in HPI.  Otherwise, a comprehensive ROS was negative.  Exam:   BP 106/66 (BP Location: Right Arm, Patient Position: Sitting, Cuff Size: Normal)   Pulse (!) 56   Resp 16   Ht 5\' 6"  (1.676 m)   Wt 159 lb 3.2 oz (72.2 kg)   LMP 05/15/2005 (Approximate)   BMI 25.70 kg/m     General appearance: alert, cooperative and appears stated age Head: Normocephalic, without  obvious abnormality, atraumatic Neck: no adenopathy, supple, symmetrical, trachea midline and thyroid normal to inspection and palpation Lungs: clear to auscultation bilaterally Breasts: normal appearance, no masses or tenderness, No nipple retraction or dimpling, No nipple discharge or bleeding, No axillary or supraclavicular adenopathy.  Bilateral implants.  No masses felt. Heart: regular rate and rhythm Abdomen: soft, non-tender; no masses, no organomegaly Extremities: extremities normal, atraumatic, no cyanosis or edema Skin: Skin color, texture, turgor normal. No rashes or lesions Lymph nodes: Cervical, supraclavicular, and axillary nodes normal. No abnormal inguinal nodes palpated Neurologic: Grossly normal  Pelvic: External genitalia:  no lesions              Urethra:  normal appearing urethra with no masses, tenderness or lesions              Bartholins and Skenes: normal                 Vagina: normal appearing vagina with normal color and discharge, no lesions              Cervix: no lesions              Pap taken: Yes.   Bimanual Exam:  Uterus:  normal size, contour, position, consistency, mobility, non-tender              Adnexa: no mass, fullness, tenderness              Rectal exam: Yes.  .  Confirms.              Anus:  normal sphincter tone, no lesions  Chaperone was present for exam.  Assessment:   Well woman visit with normal exam. Bilateral implants with possible rupture bilaterally.  Hx LEEP.   Plan: Yearly mammogram recommended after age 35.  Next year patient may need consultation with radiology prior to proceeding with mammography due to implant rupture.   Observation of implant rupture at this time. Recommended self breast exam.  Pap and HR HPV as above. Discussed Calcium, Vitamin D, regular exercise program including cardiovascular and weight bearing exercise.   Follow up annually and prn.       After visit summary provided.

## 2016-03-02 NOTE — Patient Instructions (Signed)

## 2016-03-07 ENCOUNTER — Other Ambulatory Visit: Payer: Self-pay

## 2016-03-07 MED ORDER — METOPROLOL SUCCINATE ER 25 MG PO TB24: 25.0000 mg | ORAL_TABLET | Freq: Two times a day (BID) | ORAL | 3 refills | Status: DC

## 2016-03-08 LAB — IPS PAP TEST WITH HPV

## 2016-03-27 ENCOUNTER — Ambulatory Visit: Payer: PRIVATE HEALTH INSURANCE | Admitting: Obstetrics and Gynecology

## 2016-04-29 ENCOUNTER — Encounter: Payer: Self-pay | Admitting: Interventional Cardiology

## 2016-05-01 ENCOUNTER — Other Ambulatory Visit: Payer: Self-pay | Admitting: *Deleted

## 2016-05-01 MED ORDER — METOPROLOL SUCCINATE ER 25 MG PO TB24: 25.0000 mg | ORAL_TABLET | Freq: Two times a day (BID) | ORAL | 3 refills | Status: DC

## 2016-09-12 ENCOUNTER — Encounter: Payer: Self-pay | Admitting: Interventional Cardiology

## 2016-09-27 DIAGNOSIS — I491 Atrial premature depolarization: Secondary | ICD-10-CM | POA: Insufficient documentation

## 2016-09-27 DIAGNOSIS — I493 Ventricular premature depolarization: Secondary | ICD-10-CM | POA: Insufficient documentation

## 2016-09-27 NOTE — Progress Notes (Signed)
Patient ID: Virginia Tate, female   DOB: 1955/06/28, 61 y.o.   MRN: [id number]     Cardiology Office Note   Date:  09/28/2016   ID:  Virginia Tate, DOB May 04, 1956, MRN [id number]  PCP:  Lanice Shirts, MD    No chief complaint on file. palpitations   Wt Readings from Last 3 Encounters:  09/28/16 162 lb 1.9 oz (73.5 kg)  03/02/16 159 lb 3.2 oz (72.2 kg)  11/18/15 156 lb (70.8 kg)       History of Present Illness: Virginia Tate is a 61 y.o. female  has a past history of mild mitral valve prolapse. She was seen by Dr. Mare Ferrari for many years. An echocardiogram in 2008 which showed a small degree of mitral valve prolapse and trace mitral regurgitation. She has a past history of PVCs. She has a past history of hyperlipidemia but because of favorable LDL particle size and low insulin resistance score she has not had to go on any statin therapy. SHe has  ahigh HDL as well.    The patient stays physically active. She works out with a Clinical research associate several times a week. She does Pilates. She has not played golf recently. She no longer plays tennis.   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 She does some flying to other cities to help clients.  She recently returned from [place].  Because of her increased palpitations she underwent an 2-D echocardiogram on 07/29/14 which showed normal left ventricular systolic function with ejection fraction 60-65%. There was trivial mitral regurgitation.. 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  She had a 48-hour Holter monitor in 2017 which did not show any significant arrhythmias.  She was in normal sinus rhythm. There was no atrial fibrillation. She had scattered premature atrial beats and premature ventricular beats. Some of the premature beats were interpolated. The patient previously had been on nadolol which lost efficacy. We switched her to metoprolol succinate.  She initially was on 25 mg daily and we have increased it to 50 mg daily.   On this regimen she is doing fairly well with just occasional palpitations which do not bother her much.Instead of taking 50 mg every morning, she was switched to 25 mg twice a day.  Lipids were elevated in 5/17 at PMD and we decided to consider once a day statin.    She is neighbors with Dr. Ellouise Newer.  He mentions that Dr. Evette Georges wife is on a statin, but has some side effects. She is willing to start something if necessary.    Past Medical History:  Diagnosis Date  . Bartholin's gland abscess   . Heart murmur   . Hyperlipidemia    has favorable LDL particle size and low insulin resistance score.  Marland Kitchen LGSIL (low grade squamous intraepithelial dysplasia)   . MVP (mitral valve prolapse)    No antibiotics required  . Palpitations   . PVC's (premature ventricular contractions)   . Shingles 10-22-15   torso    Past Surgical History:  Procedure Laterality Date  . APPENDECTOMY    . AUGMENTATION MAMMAPLASTY    . BLEPHAROPLASTY Bilateral 10/2015   ---[facility]  . CERVICAL BIOPSY  W/ LOOP ELECTRODE EXCISION  2006?   Had pap q 3 months all normal   . CESAREAN SECTION    . COLPOSCOPY    . TONSILLECTOMY AND ADENOIDECTOMY    . US ECHOCARDIOGRAPHY  09/03/2006  EF 55-60%     Current Outpatient Prescriptions  Medication Sig Dispense Refill  . aspirin 81 MG tablet Take 81 mg by mouth daily.      . Calcium Carbonate-Vitamin D (CALCIUM 600 + D PO) Take 650 mg by mouth daily. ADORA    . cholecalciferol (VITAMIN D) 1000 UNITS tablet Take 1,000 Units by mouth daily.      . Coenzyme Q10 (COQ10 PO) Take 200 mg by mouth daily.     . Cyanocobalamin (VITAMIN B 12 PO) Take 1,000 mcg by mouth daily.    . Magnesium 250 MG TABS Take 250 mg by mouth daily.    . metoprolol succinate (TOPROL-XL) 25 MG 24 hr tablet Take 1 tablet (25 mg total) by mouth 2 (two) times daily. 180 tablet 3  . Omega-3 Fatty Acids (OMEGA-3 FISH OIL) 1200 MG  CAPS Take 1 capsule by mouth daily.    . Probiotic Product (SOLUBLE FIBER/PROBIOTICS PO) Take 1 capsule by mouth daily.      No current facility-administered medications for this visit.     Allergies:   Patient has no known allergies.    Social History:  The patient  reports that she has never smoked. She has never used smokeless tobacco. She reports that she drinks about 1.2 oz of alcohol per week . She reports that she does not use drugs.   Family History:  The patient's family history includes Breast cancer in her paternal grandmother and sister; Dementia in her mother; Diabetes in her sister; Heart attack in her maternal grandfather; Heart failure in her father; Hypertension in her brother, father, and mother.    ROS:  Please see the history of present illness.   Otherwise, review of systems are positive for decreased palpitations.   All other systems are reviewed and negative.    PHYSICAL EXAM: VS:  BP 106/70   Pulse 64   Ht 5\' 7"  (1.702 m)   Wt 162 lb 1.9 oz (73.5 kg)   LMP 05/15/2005 (Approximate)   SpO2 98%   BMI 25.39 kg/m  , BMI Body mass index is 25.39 kg/m. GEN: Well nourished, well developed, in no acute distress  HEENT: normal  Neck: no JVD, carotid bruits, or masses Cardiac: RRR; no murmurs, rubs, or gallops,no edema  Respiratory:  clear to auscultation bilaterally, normal work of breathing GI: soft, nontender, nondistended, + BS MS: no deformity or atrophy  Skin: warm and dry, no rash Neuro:  Strength and sensation are intact Psych: euthymic mood, full affect   EKG:   The ekg ordered today demonstrates NSR, no ST changes   Recent Labs: No results found for requested labs within last 8760 hours.   Lipid Panel    Component Value Date/Time   CHOL 244 (H) 12/09/2015 0740   TRIG 50 12/09/2015 0740   HDL 95 12/09/2015 0740   CHOLHDL 2.6 12/09/2015 0740   VLDL 10 12/09/2015 0740   LDLCALC 139 (H) 12/09/2015 0740   LDLDIRECT 154.1 02/28/2013 0809       Other studies Reviewed: Additional studies/ records that were reviewed today with results demonstrating: lipids reviewed.   ASSESSMENT AND PLAN:  1.   Minimal mitral valve prolapse and minimal mitral regurgitation by prior echo. Normal LV systolic function by last echo.  No sx of CHF. 2. Sporadic PACs and PVCs on Holter monitor, no evidence of ventricular tachycardia or of atrial fibrillation.  Nadolol stopped working when it was made by a different company.  Metoprolol 25 mg  BID is working well.  Minimizes caffeine, although she does not feel increased PVCs when she does take in caffeine.  Controlled. 3. Hypercholesterolemia  : LDL 168 in 5/17 at [facility]. She exercises regularly and eats very healthy. She would like to avoid statins if possible.  LDL improved in 7/17.  Will recheck NMR lipids when fasting, and look for trend.  If she did require statin, she may only need a pill a few times a week.   Current medicines are reviewed at length with the patient today.  The patient concerns regarding her medicines were addressed.  The following changes have been made:  No change  Labs/ tests ordered today include: lipids in 2 months  No orders of the defined types were placed in this encounter.   Recommend 150 minutes/week of aerobic exercise Low fat, low carb, high fiber diet recommended  Disposition:   FU in 1 year   Signed, Larae Grooms, MD  09/28/2016 10:57 AM    [facility] [address], [place], [place]  [address] Phone: [phone]; Fax: [phone]

## 2016-09-28 ENCOUNTER — Ambulatory Visit (INDEPENDENT_AMBULATORY_CARE_PROVIDER_SITE_OTHER): Payer: PRIVATE HEALTH INSURANCE | Admitting: Interventional Cardiology

## 2016-09-28 ENCOUNTER — Encounter: Payer: Self-pay | Admitting: Interventional Cardiology

## 2016-09-28 VITALS — BP 106/70 | HR 64 | Ht 67.0 in | Wt 162.1 lb

## 2016-09-28 DIAGNOSIS — I493 Ventricular premature depolarization: Secondary | ICD-10-CM | POA: Diagnosis not present

## 2016-09-28 DIAGNOSIS — E782 Mixed hyperlipidemia: Secondary | ICD-10-CM

## 2016-09-28 DIAGNOSIS — I059 Rheumatic mitral valve disease, unspecified: Secondary | ICD-10-CM

## 2016-09-28 DIAGNOSIS — I491 Atrial premature depolarization: Secondary | ICD-10-CM | POA: Diagnosis not present

## 2016-09-28 NOTE — Patient Instructions (Signed)
Medication Instructions:  Your physician recommends that you continue on your current medications as directed. Please refer to the Current Medication list given to you today.   Labwork: Your physician recommends that you return for lab work on 10/03/2016 for FASTING  Apolipoprotein B, Lipoprotein A (LPA), NMR Lipoprofile   Testing/Procedures: None ordered.   Follow-Up: Your physician wants you to follow-up in: 1 year with Dr. Irish Lack. You will receive a reminder letter in the mail two months in advance. If you don't receive a letter, please call our office to schedule the follow-up appointment.   Any Other Special Instructions Will Be Listed Below (If Applicable).     If you need a refill on your cardiac medications before your next appointment, please call your pharmacy.

## 2016-09-29 ENCOUNTER — Other Ambulatory Visit: Payer: Self-pay | Admitting: Obstetrics and Gynecology

## 2016-09-29 DIAGNOSIS — Z1231 Encounter for screening mammogram for malignant neoplasm of breast: Secondary | ICD-10-CM

## 2016-10-03 ENCOUNTER — Other Ambulatory Visit: Payer: PRIVATE HEALTH INSURANCE

## 2016-10-03 DIAGNOSIS — E782 Mixed hyperlipidemia: Secondary | ICD-10-CM

## 2016-10-04 LAB — NMR, LIPOPROFILE
CHOLESTEROL: 218 mg/dL — AB (ref 100–199)
HDL Cholesterol by NMR: 79 mg/dL (ref >39)
HDL PARTICLE NUMBER: 38.1 umol/L (ref >=30.5)
LDL Particle Number: 1285 nmol/L — ABNORMAL HIGH (ref <1000)
LDL SIZE: 21.7 nm (ref >20.5)
LDL-C: 128 mg/dL — AB (ref 0–99)
LP-IR Score: <25 (ref <=45)
Triglycerides by NMR: 57 mg/dL (ref 0–149)

## 2016-10-04 LAB — APOLIPOPROTEIN B: APOLIPOPROTEIN B: 100 mg/dL (ref 54–133)

## 2016-10-04 LAB — LIPOPROTEIN A (LPA): Lipoprotein (a): 19 nmol/L (ref <75)

## 2016-11-02 ENCOUNTER — Ambulatory Visit
Admission: RE | Admit: 2016-11-02 | Discharge: 2016-11-02 | Disposition: A | Discharge status: Home / Self Care | Payer: PRIVATE HEALTH INSURANCE | Source: Ambulatory Visit | Attending: Obstetrics and Gynecology | Admitting: Obstetrics and Gynecology

## 2016-11-02 DIAGNOSIS — Z1231 Encounter for screening mammogram for malignant neoplasm of breast: Secondary | ICD-10-CM

## 2017-03-12 ENCOUNTER — Encounter: Payer: Self-pay | Admitting: Obstetrics and Gynecology

## 2017-03-12 ENCOUNTER — Other Ambulatory Visit (HOSPITAL_COMMUNITY)
Admission: RE | Admit: 2017-03-12 | Discharge: 2017-03-12 | Disposition: A | Discharge status: Home / Self Care | Payer: PRIVATE HEALTH INSURANCE | Source: Ambulatory Visit | Attending: Obstetrics and Gynecology | Admitting: Obstetrics and Gynecology

## 2017-03-12 ENCOUNTER — Ambulatory Visit (INDEPENDENT_AMBULATORY_CARE_PROVIDER_SITE_OTHER): Payer: PRIVATE HEALTH INSURANCE | Admitting: Obstetrics and Gynecology

## 2017-03-12 VITALS — BP 110/58 | HR 68 | Resp 14 | Ht 66.0 in | Wt 165.4 lb

## 2017-03-12 DIAGNOSIS — Z01419 Encounter for gynecological examination (general) (routine) without abnormal findings: Secondary | ICD-10-CM

## 2017-03-12 NOTE — Patient Instructions (Signed)

## 2017-03-12 NOTE — Progress Notes (Signed)
61 y.o. G50P2003 Married Caucasian female here for annual exam.    Vaginal dryness.  Used Estring in the past.   Prior mammogram suggesting bilateral implant rupture.  After consultation with Dr. Dessie Coma and multiple radiologists, this was then thought to be a nonissue. No surgery was done.   ROS - urinary incontinence with sneeze or cough occasionally.  Labs with PCP and cardiology.  PCP:  Dr. Coralyn Mark   Patient's last menstrual period was 05/15/2005 (approximate).           Sexually active: Yes.    The current method of family planning is post menopausal status.    Exercising: Yes.    cardio, weights, and pilates Smoker:  no  Health Maintenance: Pap: 03-02-16 Neg:Neg HR HPV, 01-01-15 Neg:Neg HR HPV,  Pap 10/23/13 - normal and positive HR HPV.  No subtyping.  History of abnormal Pap:  Yes, Hx of LEEP with Dr. Cherylann Banas 10 years ago, had pap q 3 months for a year all paps were normal  MMG: 11-02-16 Pt.with Prepectoral Implants/density B/Neg/BiRads1:TBC Colonoscopy: 02/24/2010 Normal f/u in 2021.  BMD: 09-09-09 Result :Normal TDaP:  2012 Gardasil:   no HIV: blood donor in the past Hep C:done with [facility]  Screening Labs:  PCP and Cardiologist   reports that she has never smoked. She has never used smokeless tobacco. She reports that she drinks about 1.2 oz of alcohol per week . She reports that she does not use drugs.  Past Medical History:  Diagnosis Date  . Bartholin's gland abscess   . Heart murmur   . Hyperlipidemia    has favorable LDL particle size and low insulin resistance score.  Marland Kitchen LGSIL (low grade squamous intraepithelial dysplasia)   . MVP (mitral valve prolapse)    No antibiotics required  . Palpitations   . PVC's (premature ventricular contractions)   . Shingles 10-22-15   torso    Past Surgical History:  Procedure Laterality Date  . APPENDECTOMY    . AUGMENTATION MAMMAPLASTY Bilateral   . BLEPHAROPLASTY Bilateral 10/2015   ---[facility]  .  CERVICAL BIOPSY  W/ LOOP ELECTRODE EXCISION  2006?   Had pap q 3 months all normal   . CESAREAN SECTION    . COLPOSCOPY    . TONSILLECTOMY AND ADENOIDECTOMY    . US ECHOCARDIOGRAPHY  09/03/2006   EF 55-60%    Current Outpatient Prescriptions  Medication Sig Dispense Refill  . aspirin 81 MG tablet Take 81 mg by mouth 3 (three) times a week.     . Calcium Carbonate-Vitamin D (CALCIUM 600 + D PO) Take 650 mg by mouth daily. ADORA    . cholecalciferol (VITAMIN D) 1000 UNITS tablet Take 1,000 Units by mouth daily.      . Coenzyme Q10 (COQ10 PO) Take 200 mg by mouth daily.     . Cyanocobalamin (VITAMIN B 12 PO) Take 1,000 mcg by mouth daily.    . Magnesium 250 MG TABS Take 250 mg by mouth daily.    . metoprolol succinate (TOPROL-XL) 25 MG 24 hr tablet Take 1 tablet (25 mg total) by mouth 2 (two) times daily. 180 tablet 3  . Omega-3 Fatty Acids (OMEGA-3 FISH OIL) 1200 MG CAPS Take 1 capsule by mouth daily.    . Probiotic Product (SOLUBLE FIBER/PROBIOTICS PO) Take 1 capsule by mouth daily.      No current facility-administered medications for this visit.     Family History  Problem Relation Age of Onset  . Heart failure  Father   . Hypertension Father   . Hypertension Mother   . Dementia Mother   . Diabetes Sister   . Breast cancer Sister        Age 50  . Hypertension Brother   . Breast cancer Paternal Grandmother        Age 42's  . Heart attack Maternal Grandfather     ROS:  Pertinent items are noted in HPI.  Otherwise, a comprehensive ROS was negative.  Exam:   BP (!) 110/58 (BP Location: Right Arm, Patient Position: Sitting, Cuff Size: Large)   Pulse 68   Resp 14   Ht 5\' 6"  (1.676 m)   Wt 165 lb 6.4 oz (75 kg)   LMP 05/15/2005 (Approximate)   BMI 26.70 kg/m     General appearance: alert, cooperative and appears stated age Head: Normocephalic, without obvious abnormality, atraumatic Neck: no adenopathy, supple, symmetrical, trachea midline and thyroid normal to inspection  and palpation Lungs: clear to auscultation bilaterally Breasts: bilateral implants, no masses or tenderness, No nipple retraction or dimpling, No nipple discharge or bleeding, No axillary or supraclavicular adenopathy Heart: regular rate and rhythm Abdomen: soft, non-tender; no masses, no organomegaly Extremities: extremities normal, atraumatic, no cyanosis or edema Skin: Skin color, texture, turgor normal. No rashes or lesions Lymph nodes: Cervical, supraclavicular, and axillary nodes normal. No abnormal inguinal nodes palpated Neurologic: Grossly normal  Pelvic: External genitalia:  no lesions              Urethra:  normal appearing urethra with no masses, tenderness or lesions              Bartholins and Skenes: normal                 Vagina: normal appearing vagina with normal color and discharge, no lesions              Cervix: no lesions              Pap taken: Yes.   Bimanual Exam:  Uterus:  normal size, contour, position, consistency, mobility, non-tender              Adnexa: no mass, fullness, tenderness              Rectal exam: Yes.  .  Confirms.              Anus:  normal sphincter tone, no lesions  Chaperone was present for exam.  Assessment:   Well woman visit with normal exam. Bilateral implants.  Hx LEEP.  Hx positive HR HPV in 2015 with negative testing in 2016 and 2017. Mild GSI.  Plan: Mammogram screening discussed. Recommended self breast awareness. Pap and HR HPV as above. Guidelines for Calcium, Vitamin D, regular exercise program including cardiovascular and weight bearing exercise. Labs with cardiology and PCP.   Kegel's. Follow up annually and prn.   After visit summary provided.

## 2017-03-14 LAB — CYTOLOGY - PAP
DIAGNOSIS: NEGATIVE
HPV: NOT DETECTED

## 2017-06-03 ENCOUNTER — Other Ambulatory Visit: Payer: Self-pay | Admitting: Interventional Cardiology

## 2017-08-16 ENCOUNTER — Other Ambulatory Visit: Payer: Self-pay | Admitting: Interventional Cardiology

## 2017-10-02 ENCOUNTER — Other Ambulatory Visit: Payer: Self-pay | Admitting: Obstetrics and Gynecology

## 2017-10-02 ENCOUNTER — Other Ambulatory Visit: Payer: Self-pay | Admitting: Internal Medicine

## 2017-10-02 DIAGNOSIS — Z1231 Encounter for screening mammogram for malignant neoplasm of breast: Secondary | ICD-10-CM

## 2017-10-27 ENCOUNTER — Other Ambulatory Visit: Payer: Self-pay | Admitting: Interventional Cardiology

## 2017-11-05 ENCOUNTER — Encounter: Payer: Self-pay | Admitting: Interventional Cardiology

## 2017-11-05 ENCOUNTER — Ambulatory Visit (INDEPENDENT_AMBULATORY_CARE_PROVIDER_SITE_OTHER): Payer: PRIVATE HEALTH INSURANCE | Admitting: Interventional Cardiology

## 2017-11-05 VITALS — BP 104/66 | HR 60 | Ht 66.0 in | Wt 168.8 lb

## 2017-11-05 DIAGNOSIS — I491 Atrial premature depolarization: Secondary | ICD-10-CM

## 2017-11-05 DIAGNOSIS — I059 Rheumatic mitral valve disease, unspecified: Secondary | ICD-10-CM | POA: Diagnosis not present

## 2017-11-05 DIAGNOSIS — E782 Mixed hyperlipidemia: Secondary | ICD-10-CM

## 2017-11-05 DIAGNOSIS — I493 Ventricular premature depolarization: Secondary | ICD-10-CM | POA: Diagnosis not present

## 2017-11-05 NOTE — Progress Notes (Signed)
Cardiology Office Note   Date:  11/05/2017   ID:  Virginia Tate, DOB Oct 08, 1955, MRN [id number]  PCP:  Lanice Shirts, MD    No chief complaint on file.   hyperlipidemia  Wt Readings from Last 3 Encounters:  03/12/17 165 lb 6.4 oz (75 kg)  09/28/16 162 lb 1.9 oz (73.5 kg)  03/02/16 159 lb 3.2 oz (72.2 kg)       History of Present Illness: Virginia Tate is a 62 y.o. female  has a past history of mild mitral valve prolapse. She was seen by Dr. Mare Ferrari for many years. An echocardiogram in 2008 which showed a small degree of mitral valve prolapse and trace mitral regurgitation. She has a past history of PVCs. She has a past history of hyperlipidemia but because of favorable LDL particle size and low insulin resistance score she has not had to go on any statin therapy. SHe has a high HDL as well.  Because of her increased palpitations she underwent an 2-D echocardiogram on 07/29/14 which showed normal left ventricular systolic function with ejection fraction 60-65%. There was trivial mitral regurgitation.. 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  She had a 48-hour Holter monitor in 2017 which did not show any significant arrhythmias.  She was in normal sinus rhythm. There was no atrial fibrillation. She had scattered premature atrial beats and premature ventricular beats. Some of the premature beats were interpolated. The patient previously had been on nadolol which lost efficacy. We switched her to metoprolol succinate. She initially was on 25 mg daily and we have increased it to 50 mg daily.   On this regimen she did fairly well with just occasional palpitations which did not bother her much.Instead of taking 50 mg every morning, she was switched to 25 mg twice a day.  Lipids were elevated in 5/17 at PMD and we decided to consider once a day statin.    She is neighbors with Dr. Ellouise Newer.    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   She still works out 4 days a week.  Denies : Chest pain. Dizziness. Leg edema. Nitroglycerin use. Orthopnea. Palpitations. Paroxysmal nocturnal dyspnea. Shortness of breath. Syncope.       Past Medical History:  Diagnosis Date  . Bartholin's gland abscess   . Heart murmur   . Hyperlipidemia    has favorable LDL particle size and low insulin resistance score.  Marland Kitchen LGSIL (low grade squamous intraepithelial dysplasia)   . MVP (mitral valve prolapse)    No antibiotics required  . Palpitations   . PVC's (premature ventricular contractions)   . Shingles 10-22-15   torso    Past Surgical History:  Procedure Laterality Date  . APPENDECTOMY    . AUGMENTATION MAMMAPLASTY Bilateral   . BLEPHAROPLASTY Bilateral 10/2015   ---[facility]  . CERVICAL BIOPSY  W/ LOOP ELECTRODE EXCISION  2006?   Had pap q 3 months all normal   . CESAREAN SECTION    . COLPOSCOPY    . TONSILLECTOMY AND ADENOIDECTOMY    . US ECHOCARDIOGRAPHY  09/03/2006   EF 55-60%     Current Outpatient Medications  Medication Sig Dispense Refill  . aspirin 81 MG tablet Take 81 mg by mouth 3 (three) times a week.     . Calcium Carbonate-Vitamin D (CALCIUM 600 + D PO) Take 650  mg by mouth daily. ADORA    . cholecalciferol (VITAMIN D) 1000 UNITS tablet Take 1,000 Units by mouth daily.      . Coenzyme Q10 (COQ10 PO) Take 200 mg by mouth daily.     . Cyanocobalamin (VITAMIN B 12 PO) Take 1,000 mcg by mouth daily.    . Magnesium 250 MG TABS Take 250 mg by mouth daily.    . metoprolol succinate (TOPROL-XL) 25 MG 24 hr tablet TAKE 1 TABLET BY MOUTH 2  TIMES DAILY. 180 tablet 0  . Omega-3 Fatty Acids (OMEGA-3 FISH OIL) 1200 MG CAPS Take 1 capsule by mouth daily.    . Probiotic Product (SOLUBLE FIBER/PROBIOTICS PO) Take 1 capsule by mouth daily.      No current facility-administered medications for this visit.     Allergies:   Patient has  no known allergies.    Social History:  The patient  reports that she has never smoked. She has never used smokeless tobacco. She reports that she drinks about 1.2 oz of alcohol per week. She reports that she does not use drugs.   Family History:  The patient's family history includes Breast cancer in her paternal grandmother and sister; Dementia in her mother; Diabetes in her sister; Heart attack in her maternal grandfather; Heart failure in her father; Hypertension in her brother, father, and mother.    ROS:  Please see the history of present illness.   Otherwise, review of systems are positive for palpitations under control.   All other systems are reviewed and negative.    PHYSICAL EXAM: VS:  LMP 05/15/2005 (Approximate)  , BMI There is no height or weight on file to calculate BMI. GEN: Well nourished, well developed, in no acute distress  HEENT: normal  Neck: no JVD, carotid bruits, or masses Cardiac: RRR; no murmurs, rubs, or gallops,no edema  Respiratory:  clear to auscultation bilaterally, normal work of breathing GI: soft, nontender, nondistended, + BS MS: no deformity or atrophy  Skin: warm and dry, no rash Neuro:  Strength and sensation are intact Psych: euthymic mood, full affect   EKG:   The ekg ordered today demonstrates Normal ECG   Recent Labs: No results found for requested labs within last 8760 hours.   Lipid Panel    Component Value Date/Time   CHOL 218 (H) 10/03/2016 0810   TRIG 57 10/03/2016 0810   HDL 79 10/03/2016 0810   CHOLHDL 2.6 12/09/2015 0740   VLDL 10 12/09/2015 0740   LDLCALC 139 (H) 12/09/2015 0740   LDLDIRECT 154.1 02/28/2013 0809     Other studies Reviewed: Additional studies/ records that were reviewed today with results demonstrating: HDL 79, LDL 153 in 10/18.   ASSESSMENT AND PLAN:  1. Mitral valve prolapse: No evidence of CHF.  No murmur noted on exam. 2. Sporadic PACs and PVCs: Controlled.  COntinue beta  blocker. 3. Hyperlipidemia: We discussed treatment option with statin.  We discussed once a week statin which may get her LDL down.   we also discussed doing a calcium screening CT.  If there was calcium, we would be more aggressive with treating.  If not, we would let her continue aggressive primary prevention with lifestyle changes.  She does have a very high HDL that is favorable for her.  She will discuss with her husband who is a radiologist, and let us know if she wants to do the calcium scoring CT.  Current medicines are reviewed at length with the patient today.  The patient concerns regarding her medicines were addressed.  The following changes have been made:  No change  Labs/ tests ordered today include:  No orders of the defined types were placed in this encounter.   Recommend 150 minutes/week of aerobic exercise Low fat, low carb, high fiber diet recommended  Disposition:   FU in 1 year   Signed, Larae Grooms, MD  11/05/2017 1:10 PM    [facility] [place], [place], [place]  [address] Phone: [phone]; Fax: [phone]

## 2017-11-05 NOTE — Patient Instructions (Signed)

## 2017-11-06 ENCOUNTER — Ambulatory Visit
Admission: RE | Admit: 2017-11-06 | Discharge: 2017-11-06 | Disposition: A | Discharge status: Home / Self Care | Payer: PRIVATE HEALTH INSURANCE | Source: Ambulatory Visit | Attending: Internal Medicine | Admitting: Internal Medicine

## 2017-11-06 DIAGNOSIS — Z1231 Encounter for screening mammogram for malignant neoplasm of breast: Secondary | ICD-10-CM

## 2017-11-20 ENCOUNTER — Other Ambulatory Visit: Payer: Self-pay | Admitting: Orthopedic Surgery

## 2017-11-20 DIAGNOSIS — M25562 Pain in left knee: Secondary | ICD-10-CM

## 2017-11-26 ENCOUNTER — Ambulatory Visit
Admission: RE | Admit: 2017-11-26 | Discharge: 2017-11-26 | Disposition: A | Discharge status: Home / Self Care | Payer: PRIVATE HEALTH INSURANCE | Source: Ambulatory Visit | Attending: Orthopedic Surgery | Admitting: Orthopedic Surgery

## 2017-11-26 DIAGNOSIS — M25562 Pain in left knee: Secondary | ICD-10-CM

## 2018-01-08 ENCOUNTER — Other Ambulatory Visit: Payer: Self-pay | Admitting: Interventional Cardiology

## 2018-03-20 ENCOUNTER — Encounter: Payer: Self-pay | Admitting: Obstetrics and Gynecology

## 2018-03-20 ENCOUNTER — Other Ambulatory Visit: Payer: Self-pay

## 2018-03-20 ENCOUNTER — Ambulatory Visit (INDEPENDENT_AMBULATORY_CARE_PROVIDER_SITE_OTHER): Payer: PRIVATE HEALTH INSURANCE | Admitting: Obstetrics and Gynecology

## 2018-03-20 ENCOUNTER — Other Ambulatory Visit (HOSPITAL_COMMUNITY)
Admission: RE | Admit: 2018-03-20 | Discharge: 2018-03-20 | Disposition: A | Discharge status: Home / Self Care | Payer: PRIVATE HEALTH INSURANCE | Source: Ambulatory Visit | Attending: Obstetrics and Gynecology | Admitting: Obstetrics and Gynecology

## 2018-03-20 VITALS — BP 118/62 | HR 64 | Resp 16 | Ht 66.0 in | Wt 170.8 lb

## 2018-03-20 DIAGNOSIS — Z01419 Encounter for gynecological examination (general) (routine) without abnormal findings: Secondary | ICD-10-CM

## 2018-03-20 DIAGNOSIS — Z780 Asymptomatic menopausal state: Secondary | ICD-10-CM

## 2018-03-20 NOTE — Progress Notes (Signed)
62 y.o. G4P2003 Married Caucasian female here for annual exam.   Patient complaining with pain with intercourse--dryness.  Used Estring, which was helpful, but she stopped this due to concern.  Using water based lubrication.   Wants yearly pap.   Just back from a cruise in [place].  Son engaged to be married.   PCP: Elsie Ra  Patient's last menstrual period was 05/15/2005 (approximate).           Sexually active: Yes.   female The current method of family planning is post menopausal status.    Exercising: Yes.    cardio, weights, pilates and walking Smoker:  no  Health Maintenance: Pap: 03-12-17 Neg:Neg HR HPV, 03-02-16 Neg:NEg HR HPV History of abnormal Pap:  Yes, Hx of LEEP with Dr. Cherylann Banas 10 years ago, had pap q 3 months for a year all paps were normal  MMG: 11-06-17 Bil.Implants/Neg/density B/BiRads1 Colonoscopy: 02/24/2010 Normal f/u in 2021.  BMD: 09-09-09 Result :Normal TDaP:  2012 Gardasil:   no HIV: blood donor in past Hep C: done with [facility] Screening Labs:  Hb today: PCP   reports that she has never smoked. She has never used smokeless tobacco. She reports that she drinks about 2.0 standard drinks of alcohol per week. She reports that she does not use drugs.  Past Medical History:  Diagnosis Date  . Bartholin's gland abscess   . Heart murmur   . Hyperlipidemia    has favorable LDL particle size and low insulin resistance score.  Marland Kitchen LGSIL (low grade squamous intraepithelial dysplasia)   . MVP (mitral valve prolapse)    No antibiotics required  . Palpitations   . PVC's (premature ventricular contractions)   . Shingles 10-22-15   torso    Past Surgical History:  Procedure Laterality Date  . APPENDECTOMY    . AUGMENTATION MAMMAPLASTY Bilateral   . BLEPHAROPLASTY Bilateral 10/2015   ---[facility]  . CERVICAL BIOPSY  W/ LOOP ELECTRODE EXCISION  2006?   Had pap q 3 months all normal   . CESAREAN SECTION    . COLPOSCOPY    . TONSILLECTOMY  AND ADENOIDECTOMY    . US ECHOCARDIOGRAPHY  09/03/2006   EF 55-60%    Current Outpatient Medications  Medication Sig Dispense Refill  . aspirin 81 MG tablet Take 81 mg by mouth 3 (three) times a week.     . Calcium Carbonate-Vitamin D (CALCIUM 600 + D PO) Take 650 mg by mouth daily. ADORA    . cholecalciferol (VITAMIN D) 1000 UNITS tablet Take 1,000 Units by mouth daily.      . Coenzyme Q10 (COQ10 PO) Take 200 mg by mouth daily.     . Cyanocobalamin (VITAMIN B 12 PO) Take 1,000 mcg by mouth daily.    . Magnesium 250 MG TABS Take 250 mg by mouth daily.    . metoprolol succinate (TOPROL-XL) 25 MG 24 hr tablet TAKE 1 TABLET BY MOUTH 2  TIMES DAILY. 180 tablet 2  . Omega-3 Fatty Acids (OMEGA-3 FISH OIL) 1200 MG CAPS Take 1 capsule by mouth daily.    . Probiotic Product (SOLUBLE FIBER/PROBIOTICS PO) Take 1 capsule by mouth daily.      No current facility-administered medications for this visit.     Family History  Problem Relation Age of Onset  . Heart failure Father   . Hypertension Father   . Hypertension Mother   . Dementia Mother   . Diabetes Sister   . Breast cancer Sister  Age 5  . Hypertension Brother   . Breast cancer Paternal Grandmother        Age 50's  . Heart attack Maternal Grandfather     Review of Systems  All other systems reviewed and are negative.   Exam:   BP 118/62 (BP Location: Right Arm, Patient Position: Sitting, Cuff Size: Normal)   Pulse 64   Resp 16   Ht 5\' 6"  (1.676 m)   Wt 170 lb 12.8 oz (77.5 kg)   LMP 05/15/2005 (Approximate)   BMI 27.57 kg/m     General appearance: alert, cooperative and appears stated age Head: Normocephalic, without obvious abnormality, atraumatic Neck: no adenopathy, supple, symmetrical, trachea midline and thyroid normal to inspection and palpation Lungs: clear to auscultation bilaterally Breasts: bilateral implants,  normal appearance, no masses or tenderness, No nipple retraction or dimpling, No nipple  discharge or bleeding, No axillary or supraclavicular adenopathy Heart: regular rate and rhythm Abdomen: soft, non-tender; no masses, no organomegaly Extremities: extremities normal, atraumatic, no cyanosis or edema Skin: Skin color, texture, turgor normal. No rashes or lesions Lymph nodes: Cervical, supraclavicular, and axillary nodes normal. No abnormal inguinal nodes palpated Neurologic: Grossly normal  Pelvic: External genitalia:  no lesions              Urethra:  normal appearing urethra with no masses, tenderness or lesions              Bartholins and Skenes: normal                 Vagina: normal appearing vagina with normal color and discharge, no lesions              Cervix: no lesions              Pap taken: Yes.   Bimanual Exam:  Uterus:  normal size, contour, position, consistency, mobility, non-tender              Adnexa: no mass, fullness, tenderness              Rectal exam: Yes.  .  Confirms.              Anus:  normal sphincter tone, no lesions  Chaperone was present for exam.  Assessment:   Well woman visit with normal exam. Bilateral implants.  Hx LEEP.  Hx positive HR HPV in 2015 with negative testing in 2016 and 2017. Hx mild GSI. Vaginal atrophy.   Plan: Mammogram screening. Recommended self breast awareness. Pap and HR HPV as above. Guidelines for Calcium, Vitamin D, regular exercise program including cardiovascular and weight bearing exercise. We discussed cooking oils and vit E, local vaginal estrogen, [place], and [place].  Patient will try vit E and call back if she wishes to return to local vaginal estrogen tx.  I discussed potential effect of vaginal estrogen on breast cancer.  BMD ordered.  Patient will schedule at [facility]. Labs with PCP.  Follow up annually and prn.   After visit summary provided.

## 2018-03-20 NOTE — Patient Instructions (Signed)

## 2018-03-25 LAB — CYTOLOGY - PAP
DIAGNOSIS: UNDETERMINED — AB
HPV: NOT DETECTED

## 2018-03-29 ENCOUNTER — Encounter: Payer: Self-pay | Admitting: Obstetrics and Gynecology

## 2018-04-01 ENCOUNTER — Telehealth: Payer: Self-pay | Admitting: Obstetrics and Gynecology

## 2018-04-01 MED ORDER — ESTRADIOL 2 MG VA RING: 2.0000 mg | VAGINAL_RING | VAGINAL | 3 refills | Status: DC

## 2018-04-01 NOTE — Telephone Encounter (Signed)
Sp[facility] with [facility] at [facility]. Was advise PA required for Estring 2mg  ring, pv q50mo. Insurance will only cover 1 ring monthly.

## 2018-04-01 NOTE — Telephone Encounter (Signed)
Message left to return call to Triage Nurse at [phone].    

## 2018-04-01 NOTE — Telephone Encounter (Signed)
PA submitted for Estring 2mg  via [email or web address]  Key: [id number]

## 2018-04-01 NOTE — Telephone Encounter (Signed)
Patient returned call. Results reviewed with patient as seen below from Dr. Quincy Simmonds and patient verbalized understanding. 6 month follow up pap smear scheduled for 09-30-18 at 1500. Patient agreeable to date and time of appointment. Recall placed for 09-2018. Patient states she is open to whatever Dr. Quincy Simmonds recommends for vaginal estrogen, but has used estring in the past. States it has been 2-3 years since she last used it so will need a refill. [facility] [address] RN advised would review with Dr. Quincy Simmonds and return call. Patient agreeable.   Routing to provider for review.

## 2018-04-01 NOTE — Telephone Encounter (Signed)
Call to patient. RN advised will send in RX for estring per Dr. Quincy Simmonds. Patient agreeable. Instructions on use reviewed with patient and she verbalized understanding. RX for estring 2mg , #1, 3RF [facility] [address] per patient request.   Routing to provider and will close encounter.

## 2018-04-01 NOTE — Telephone Encounter (Signed)
Unable to submit PA for Estring through [email or web address]. Call placed to [facility] to submit PA. Spoke with Chrys Racer. PA submitted, will be notified of response via fax in 24hrs.

## 2018-04-01 NOTE — Telephone Encounter (Signed)
Patient sent the following correspondence through [place]. Routing to provider to review and close encounter as necessary.  Thank you. I will call the office for a prescription  this week .  ----- Message -----  From: Arloa Koh, MD  Sent: 03/31/2018 8:48 PM EST  To: Virginia Tate  Subject: RE: Visit Follow-Up Question  Hello Virginia Tate,     I sent a message through to our triage nurses to contact you, but It looks like you were not reached by phone prior to the Epic electronic medical record releasing the result to you.   The pap showed very minor changes in the cells called atypia, and your high risk human papilloma virus status is negative.  Your pap is not a high risk pap and nothing need to be done at this time with respect to further evaluation because of the negative human papilloma virus status.  The pathologist commented that they could not see internal cervical cells, and this can occur due to the lack of estrogen.   I do recommend that we have you return to the use of your vaginal estrogen.  Please contact the office so we can prescribe this to your pharmacy of choice.   I would like to see you in 6 months for a repeat pap smear.    Thank you for checking in!    Josefa Half      ----- Message -----   From: Virginia Tate   Sent: 03/29/2018 7:45 PM EST    To: Arloa Koh, MD  Subject: Visit Follow-Up Question    So what does this atypical squamous cell pap smear result actually mean and do we need to do anything?

## 2018-04-01 NOTE — Telephone Encounter (Signed)
[place] for [employer]. Sig:  Place in vagina for 90 days.  Disp:  One RF:  3.

## 2018-04-01 NOTE — Telephone Encounter (Signed)
-----   Message from Nunzio Cobbs, MD sent at 03/29/2018  6:37 AM EST ----- Please contact patient with results.  Her pap is ASCUS and negative HR HPV.  She does not need a colposcopy, but I will have her do a pap and HPV testing in 6 months as the labs reported that they could not confirm endocervical cells due to atrophy. I would recommend she return to the use of vaginal estrogen, which she had recently discontinued.  This will help with getting more adequate cells of the internal cervix.  Recall for May 2020.

## 2018-04-01 NOTE — Telephone Encounter (Signed)
[facility] calling to speak with nurse regarding prior authorization for estring. [phone] [id number]

## 2018-04-01 NOTE — Telephone Encounter (Signed)
Triage, please reach out to patient in follow up to the My Chart message and triage message I sent with results.

## 2018-04-03 NOTE — Telephone Encounter (Signed)
Spoke with pharmacist, Willow Ora, at [facility]. No PA required. RX for Estring will need to be filled through mail order for coverage of 90day supply. Verbal order given for Estring 2mg  vaginal ring, pv q45mo. #1/3RF. read back and confirmed.    Call placed to patient, advised as seen above, f/u with [facility] for filling Rx. Patient verbalizes understanding and is agreeable.   Encounter closed.

## 2018-04-03 NOTE — Telephone Encounter (Signed)
Call to f/u with [facility] on PA for Estring. Was advised no PA needed, medication is on formulary. Plan limitation is needed to dispense 1 ring for 90 days. Was advised pharmacy will need to contact [facility] to be instrcuted on how to process claim.    [facility], [facility]. Advised as seen above. Virginia Tate states she is not familiar with process, will review and return call to advise.

## 2018-04-15 ENCOUNTER — Telehealth: Payer: Self-pay | Admitting: Obstetrics and Gynecology

## 2018-04-15 NOTE — Telephone Encounter (Signed)
[facility] is calling to follow up regarding the prior authorization on this patient.

## 2018-04-15 NOTE — Telephone Encounter (Signed)
Sp[facility] with [place] [facility], calling to f/u on [employer]. See telephone encounter dated 04/01/18. Rx for Estring to [facility] to be filled. Patient notified of plan on 04/03/18.   Encounter closed.

## 2018-04-22 ENCOUNTER — Ambulatory Visit
Admission: RE | Admit: 2018-04-22 | Discharge: 2018-04-22 | Disposition: A | Discharge status: Home / Self Care | Payer: PRIVATE HEALTH INSURANCE | Source: Ambulatory Visit | Attending: Obstetrics and Gynecology | Admitting: Obstetrics and Gynecology

## 2018-04-22 DIAGNOSIS — Z780 Asymptomatic menopausal state: Secondary | ICD-10-CM

## 2018-07-09 ENCOUNTER — Encounter: Payer: Self-pay | Admitting: Obstetrics and Gynecology

## 2018-07-10 ENCOUNTER — Encounter: Payer: Self-pay | Admitting: Obstetrics and Gynecology

## 2018-07-10 ENCOUNTER — Telehealth: Payer: Self-pay | Admitting: Obstetrics and Gynecology

## 2018-07-10 MED ORDER — METOPROLOL SUCCINATE ER 25 MG PO TB24: 25.0000 mg | ORAL_TABLET | Freq: Two times a day (BID) | ORAL | 1 refills | Status: DC

## 2018-07-10 MED ORDER — ESTRADIOL 2 MG VA RING: 2.0000 mg | VAGINAL_RING | VAGINAL | 2 refills | Status: DC

## 2018-07-10 NOTE — Telephone Encounter (Signed)
Patient sent the following message through [place]. Routing to triage to assist patient with request.  Let me clarify my perscription request: I don't need you to send it to [facility], I have to have a written one for me to mail in to [facility] for their 90 day mail order refill program.

## 2018-07-10 NOTE — Telephone Encounter (Signed)
Patient sent the following message through [place]. Routing to triage to assist patient with request.  WE have changed insurance plans and I now need a new written perscription for the Estring to mail to [facility]. I am happy to pick this up at the office when it is ready. I am due for a refill March 4. Thank you.

## 2018-07-10 NOTE — Telephone Encounter (Signed)
Patient sent the following message through [place]. Routing to triage to assist patient with request.  I actually have located a fax number where you can send in the escript for the [place]. It is [phone]. So I do not need a written script if you can fax one to them. Please include my DOB 1955-06-21, my name and my phone number [phone]. Thank you so much.

## 2018-07-10 NOTE — Telephone Encounter (Signed)
Last AEX 03/20/18 Next AEX 03/24/19 MMG 11/06/17, negative Rx for Estring 2 MG vaginal ring #1/3RF sent to pharmacy on 04/01/18.   Patient requesting printed Rx to pick up.  Rx for Estring 2 MG vaginal ring #1/2RF printed and to Dr. Quincy Simmonds for review and signature.    Routing to Dr. Quincy Simmonds

## 2018-07-10 NOTE — Telephone Encounter (Signed)
Spoke with patient. Patient request Estring Rx be faxed to [place] at [phone]. Advised I will fax printed Rx as requested, f/u with pharmacy for filling. Patient verbalizes understanding and is agreeable.   Estring Rx faxed to [facility].   Encounter closed.

## 2018-07-10 NOTE — Addendum Note (Signed)
Addended by: Derl Barrow on: 07/10/2018 12:49 PM   Modules accepted: Orders

## 2018-07-10 NOTE — Telephone Encounter (Signed)
Pt's medication was sent to pt's pharmacy as requested. Confirmation received.  °

## 2018-07-22 ENCOUNTER — Ambulatory Visit (INDEPENDENT_AMBULATORY_CARE_PROVIDER_SITE_OTHER): Payer: BLUE CROSS/BLUE SHIELD | Admitting: Podiatry

## 2018-07-22 ENCOUNTER — Encounter: Payer: Self-pay | Admitting: Podiatry

## 2018-07-22 ENCOUNTER — Ambulatory Visit (INDEPENDENT_AMBULATORY_CARE_PROVIDER_SITE_OTHER): Payer: BLUE CROSS/BLUE SHIELD

## 2018-07-22 VITALS — BP 110/73 | HR 67 | Resp 16

## 2018-07-22 DIAGNOSIS — D1720 Benign lipomatous neoplasm of skin and subcutaneous tissue of unspecified limb: Secondary | ICD-10-CM

## 2018-07-22 DIAGNOSIS — M722 Plantar fascial fibromatosis: Secondary | ICD-10-CM

## 2018-07-22 DIAGNOSIS — D1779 Benign lipomatous neoplasm of other sites: Secondary | ICD-10-CM

## 2018-07-22 NOTE — Patient Instructions (Signed)

## 2018-07-22 NOTE — Progress Notes (Signed)
Subjective: 63 year old female presents the office today as a new patient for evaluation of right foot pain.  She also states she has a growth on the lateral aspect of her left ankle.  Patient states she is been having some aching pain throughout the day for the last few months to her right foot.  Constant throughout the day.  Aggravated by excessive walking.  She has not done anything for treatment currently. Patient also believes she probably has a possible ganglion cyst to the lateral aspect of her left ankle.  Her husband believes it is a possible lipoma.  It is not currently nonsymptomatic and she has noticed it there just for the last few months.  Past Medical History:  Diagnosis Date  . Bartholin's gland abscess   . Heart murmur   . Hyperlipidemia    has favorable LDL particle size and low insulin resistance score.  Marland Kitchen LGSIL (low grade squamous intraepithelial dysplasia)   . MVP (mitral valve prolapse)    No antibiotics required  . Palpitations   . PVC's (premature ventricular contractions)   . Shingles 10-22-15   torso     Objective: Physical Exam General: The patient is alert and oriented x3 in no acute distress.  Dermatology: Skin is warm, dry and supple bilateral lower extremities. Negative for open lesions or macerations bilateral.   Vascular: Dorsalis Pedis and Posterior Tibial pulses palpable bilateral.  Capillary fill time is immediate to all digits.  Neurological: Epicritic and protective threshold intact bilateral.   Musculoskeletal: Tenderness to palpation to the plantar aspect of the right heel along the plantar fascia. All other joints range of motion within normal limits bilateral. Strength 5/5 in all groups bilateral.  There is also a non-adhered fluctuant mass noted to the anterior lateral aspect of the left ankle consistent with lipoma.  Diameter approximately 3 cm.  Nontender to palpation.  Radiographic exam: Normal osseous mineralization. Joint spaces  preserved. No fracture/dislocation/boney destruction. No other soft tissue abnormalities or radiopaque foreign bodies.  There is a posterior heel spur noted to the posterior tubercle of the calcaneus right foot best visualized on lateral view.  This does not correlate with any tenderness to palpation on musculoskeletal exam.  Assessment: 1. Plantar fasciitis right 2.  Left ankle lipoma  Plan of Care:  1. Patient evaluated. Xrays reviewed.   2.  Today I discussed different treatment options both conservatively and surgically regarding the patient's pathology.   3.  At the moment I do not feel that the plantar fasciitis is symptomatic enough to warrant anti-inflammatory injection.  Patient declined oral medications. 4.  Recommend home modalities to alleviate patient's symptoms of her plantar fasciitis including a frozen water bottle, stretching exercises, OTC arch supports, and possible use of a night splint 5.  I explained that the mass to the anterior lateral aspect of the left ankle is most certainly a lipoma.  Currently it is nonsymptomatic so there is no need for concern.  If the lipoma does become symptomatic and painful there are options, most likely would include surgical excision of the lipoma.  6.  Return to clinic as needed, if pain becomes worse or persists     Edrick Kins, DPM [facility]  Dr. Edrick Kins, DPM    [address]  [place], [place] [address]                Office [phone]  Fax [phone]

## 2018-09-27 ENCOUNTER — Other Ambulatory Visit: Payer: Self-pay

## 2018-09-30 ENCOUNTER — Encounter: Payer: Self-pay | Admitting: Obstetrics and Gynecology

## 2018-09-30 ENCOUNTER — Other Ambulatory Visit: Payer: Self-pay

## 2018-09-30 ENCOUNTER — Ambulatory Visit (INDEPENDENT_AMBULATORY_CARE_PROVIDER_SITE_OTHER): Payer: BLUE CROSS/BLUE SHIELD | Admitting: Obstetrics and Gynecology

## 2018-09-30 VITALS — BP 118/80 | HR 76 | Temp 97.0°F | Resp 14 | Wt 172.0 lb

## 2018-09-30 DIAGNOSIS — N952 Postmenopausal atrophic vaginitis: Secondary | ICD-10-CM | POA: Diagnosis not present

## 2018-09-30 DIAGNOSIS — R87610 Atypical squamous cells of undetermined significance on cytologic smear of cervix (ASC-US): Secondary | ICD-10-CM

## 2018-09-30 NOTE — Progress Notes (Signed)
GYNECOLOGY  VISIT   HPI: 63 y.o.   Married  Caucasian  female   G2P2003 with Patient's last menstrual period was 05/15/2005 (approximate).   here for 6 month pap     Pap 03/20/18 showed ASCUS and negative HR HPV.  Endocervical cells could not been confirmed due to atrophy.   She has been using Estring in preparation for this pap today.   GYNECOLOGIC HISTORY: Patient's last menstrual period was 05/15/2005 (approximate). Contraception:  Postmenopausal Menopausal hormone therapy:  Estring Last mammogram:  11-06-17 Bil.Implants/Neg/density B/BiRads1 Last pap smear:   03-12-17 Neg:Neg HR HPV, 03-02-16 Neg:NEg HR HPV        OB History    Gravida  2   Para  2   Term  2   Preterm      AB      Living  3     SAB      TAB      Ectopic      Multiple  1   Live Births                 Patient Active Problem List   Diagnosis Date Noted  . PAC (premature atrial contraction) 09/27/2016  . PVC (premature ventricular contraction) 09/27/2016  . 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 Visual field defect 09/09/2015  . LGSIL (low grade squamous intraepithelial dysplasia)   . MVP (mitral valve prolapse) 02/21/2011  . Hyperlipidemia 02/21/2011  . Palpitation 02/21/2011    Past Medical History:  Diagnosis Date  . Bartholin's gland abscess   . Heart murmur   . Hyperlipidemia    has favorable LDL particle size and low insulin resistance score.  Marland Kitchen LGSIL (low grade squamous intraepithelial dysplasia)   . MVP (mitral valve prolapse)    No antibiotics required  . Palpitations   . PVC's (premature ventricular contractions)   . Shingles 10-22-15   torso    Past Surgical History:  Procedure Laterality Date  . APPENDECTOMY    . AUGMENTATION MAMMAPLASTY Bilateral   . BLEPHAROPLASTY Bilateral 10/2015   ---[facility]  . CERVICAL BIOPSY  W/ LOOP ELECTRODE EXCISION  2006?   Had pap q 3 months all normal   . CESAREAN SECTION    . COLPOSCOPY    . TONSILLECTOMY AND ADENOIDECTOMY     . US ECHOCARDIOGRAPHY  09/03/2006   EF 55-60%    Current Outpatient Medications  Medication Sig Dispense Refill  . Calcium Carbonate-Vitamin D (CALCIUM 600 + D PO) Take 650 mg by mouth daily. ADORA    . cholecalciferol (VITAMIN D) 1000 UNITS tablet Take 1,000 Units by mouth daily.      . Coenzyme Q10 (COQ10 PO) Take 200 mg by mouth daily.     . Cyanocobalamin (VITAMIN B 12 PO) Take 1,000 mcg by mouth daily.    Marland Kitchen estradiol (ESTRING) 2 MG vaginal ring Place 2 mg vaginally every 3 (three) months. follow package directions 1 each 2  . metoprolol succinate (TOPROL-XL) 25 MG 24 hr tablet Take 1 tablet (25 mg total) by mouth 2 (two) times daily. Please keep upcoming appt in June for future refills. Thank you 180 tablet 1  . Omega-3 Fatty Acids (OMEGA-3 FISH OIL) 1200 MG CAPS Take 1 capsule by mouth daily.    . Probiotic Product (SOLUBLE FIBER/PROBIOTICS PO) Take 1 capsule by mouth daily.      No current facility-administered medications for this visit.      ALLERGIES: Patient has no known allergies.  Family History  Problem Relation Age of Onset  . Heart failure Father   . Hypertension Father   . Hypertension Mother   . Dementia Mother   . Diabetes Sister   . Breast cancer Sister        Age 3  . Hypertension Brother   . Breast cancer Paternal Grandmother        Age 41's  . Heart attack Maternal Grandfather     Social History   Socioeconomic History  . Marital status: Married    Spouse name: Not on file  . Number of children: Not on file  . Years of education: Not on file  . Highest education level: Not on file  Occupational History  . Not on file  Social Needs  . Financial resource strain: Not on file  . Food insecurity:    Worry: Not on file    Inability: Not on file  . Transportation needs:    Medical: Not on file    Non-medical: Not on file  Tobacco Use  . Smoking status: Never Smoker  . Smokeless tobacco: Never Used  Substance and Sexual Activity  . Alcohol  use: Yes    Alcohol/week: 2.0 standard drinks    Types: 2 Standard drinks or equivalent per week    Comment: socially  . Drug use: No  . Sexual activity: Yes    Partners: Male    Birth control/protection: Post-menopausal  Lifestyle  . Physical activity:    Days per week: Not on file    Minutes per session: Not on file  . Stress: Not on file  Relationships  . Social connections:    Talks on phone: Not on file    Gets together: Not on file    Attends religious service: Not on file    Active member of club or organization: Not on file    Attends meetings of clubs or organizations: Not on file    Relationship status: Not on file  . Intimate partner violence:    Fear of current or ex partner: Not on file    Emotionally abused: Not on file    Physically abused: Not on file    Forced sexual activity: Not on file  Other Topics Concern  . Not on file  Social History Narrative  . Not on file    Review of Systems  Constitutional: Negative.   HENT: Negative.   Eyes: Negative.   Respiratory: Negative.   Cardiovascular: Negative.   Gastrointestinal: Negative.   Endocrine: Negative.   Genitourinary: Negative.   Musculoskeletal: Negative.   Skin: Negative.   Allergic/Immunologic: Negative.   Neurological: Negative.   Hematological: Negative.   Psychiatric/Behavioral: Negative.     PHYSICAL EXAMINATION:    Temp (!) 97 F (36.1 C) (Oral)   Wt 172 lb (78 kg)   LMP 05/15/2005 (Approximate)   BMI 27.76 kg/m     General appearance: alert, cooperative and appears stated age   Pelvic: External genitalia:  no lesions              Urethra:  normal appearing urethra with no masses, tenderness or lesions              Bartholins and Skenes: normal                 Vagina: normal appearing vagina with normal color and discharge, no lesions              Cervix: no lesions.  Cervix  is open at external os.                 Bimanual Exam:  Uterus:  normal size, contour, position,  consistency, mobility, non-tender              Adnexa: no mass, fullness, tenderness     Chaperone was present for exam.  ASSESSMENT  Hx LEEP.  Hx positive HR HPV in 2015 with negative testing in 2016 and 2017. Last pap ASCUS and negative HR HPV.  Endocervical cells could not be confirmed.  Vaginal atrophy. Using Estring.   PLAN  Pap and HR HPV testing.  I discussed forms of local vaginal estrogen - ring, tablets, creams.  She will do some research and determine if she would like to continue the ring or not.  I mentioned GoodRx.com for price shopping.  I reviewed the potential effect of local vaginal estrogen on an existing breast cancer.    An After Visit Summary was printed and given to the patient.  ___15___ minutes face to face time of which over 50% was spent in counseling.

## 2018-10-03 LAB — CYTOLOGY - PAP
Diagnosis: NEGATIVE
HPV: NOT DETECTED

## 2018-10-21 ENCOUNTER — Telehealth: Payer: Self-pay

## 2018-10-21 NOTE — Telephone Encounter (Signed)

## 2018-10-28 ENCOUNTER — Encounter: Payer: Self-pay | Admitting: Interventional Cardiology

## 2018-10-28 ENCOUNTER — Other Ambulatory Visit: Payer: Self-pay

## 2018-10-28 ENCOUNTER — Telehealth (INDEPENDENT_AMBULATORY_CARE_PROVIDER_SITE_OTHER): Payer: PRIVATE HEALTH INSURANCE | Admitting: Interventional Cardiology

## 2018-10-28 VITALS — BP 105/70 | HR 66 | Ht 72.0 in | Wt 165.0 lb

## 2018-10-28 DIAGNOSIS — I491 Atrial premature depolarization: Secondary | ICD-10-CM

## 2018-10-28 DIAGNOSIS — I493 Ventricular premature depolarization: Secondary | ICD-10-CM

## 2018-10-28 DIAGNOSIS — I341 Nonrheumatic mitral (valve) prolapse: Secondary | ICD-10-CM

## 2018-10-28 DIAGNOSIS — E782 Mixed hyperlipidemia: Secondary | ICD-10-CM

## 2018-10-28 NOTE — Patient Instructions (Addendum)
Medication Instructions:  Your physician recommends that you continue on your current medications as directed. Please refer to the Current Medication list given to you today.  You may take an extra metoprolol once a day for palpitations  If you need a refill on your cardiac medications before your next appointment, please call your pharmacy.   Lab work: Your physician recommends that you return for a FASTING lipid profile and complete metabolic panel in November   If you have labs (blood work) drawn today and your tests are completely normal, you will receive your results only by: Marland Kitchen MyChart Message (if you have MyChart) OR . A paper copy in the mail If you have any lab test that is abnormal or we need to change your treatment, we will call you to review the results.  Testing/Procedures: None ordered  Follow-Up: At [facility], you and your health needs are our priority.  As part of our continuing mission to provide you with exceptional heart care, we have created designated Provider Care Teams.  These Care Teams include your primary Cardiologist (physician) and Advanced Practice Providers (APPs -  Physician Assistants and Nurse Practitioners) who all work together to provide you with the care you need, when you need it. . You will need a follow up appointment in 1 year.  Please call our office 2 months in advance to schedule this appointment.  You may see Casandra Doffing, MD or one of the following Advanced Practice Providers on your designated Care Team:   . Lyda Jester, PA-C . Dayna Dunn, PA-C . Ermalinda Barrios, PA-C  Any Other Special Instructions Will Be Listed Below (If Applicable).

## 2018-10-28 NOTE — Progress Notes (Signed)
Virtual Visit via Video Note   This visit type was conducted due to national recommendations for restrictions regarding the COVID-19 Pandemic (e.g. social distancing) in an effort to limit this patient's exposure and mitigate transmission in our community.  Due to her co-morbid illnesses, this patient is at least at moderate risk for complications without adequate follow up.  This format is felt to be most appropriate for this patient at this time.  All issues noted in this document were discussed and addressed.  A limited physical exam was performed with this format.  Please refer to the patient's chart for her consent to telehealth for [facility].   Video visit initially, changed to audio due to connectivity issues.   Date:  10/28/2018   ID:  Virginia Tate, DOB 1956/05/15, MRN [id number]  Patient Location: Home Provider Location: Home  PCP:  Lanice Shirts, MD  Cardiologist:  No primary care provider on file. [place] Electrophysiologist:  None   Evaluation Performed:  Follow-Up Visit  Chief Complaint:  SVT  History of Present Illness:    Virginia Tate is a 63 y.o. female who  has a past history of mild mitral valve prolapse. She was seen by Dr. Mare Ferrari for many years. An echocardiogram in 2008 which showed a small degree of mitral valve prolapse and trace mitral regurgitation. She has a past history of PVCs. She has a past history of hyperlipidemia but because of favorable LDL particle size and low insulin resistance score she has not had to go on any statin therapy. SHe has a high HDL as well.  Because of her increased palpitations she underwent an 2-D echocardiogram on 07/29/14 which showed normal left ventricular systolic function with ejection fraction 60-65%. There was trivial mitral regurgitation.. 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  She had a 48-hour Holter monitorin 2017which did not show anysignificant arrhythmias. She was in  normal sinus rhythm. There was no atrial fibrillation. She had scattered premature atrial beats and premature ventricular beats. Some of the premature beats were interpolated. The patient previously had been on nadolol which lost efficacy. We switched her to metoprolol succinate. She initially was on 25 mg daily and we have increased it to 50 mg daily.   On this regimen she did fairly well with just occasional palpitations which did not bother her much.Instead of taking 50 mg every morning, she was switched to 25 mg twice a day.  Lipids were elevated in 5/17 at [facility] we decided to consider once a week statin. She had a 2016 renal stone CT and heart was partially visualized by her husband and there was no evidence of coronary calcium.  THerefore, we have not treated with a statin.  We discussed a coronary CT calcium scoring scan.    She is neighbors with Dr. Ellouise Newer.   She is walking regularly.  She rides a bike.  She has been physically more active.  No palpitations with exercise.  She has had a few episodes of palpitations at rest where they were more persistent.  She has not used extra metoprolol.    Denies : Chest pain. Dizziness. Leg edema. Nitroglycerin use. Orthopnea. Paroxysmal nocturnal dyspnea. Shortness of breath. Syncope.   The patient does not have symptoms concerning for COVID-19 infection (fever, chills, cough, or new shortness of breath).    Past Medical History:  Diagnosis Date  . Bartholin's gland abscess   . Heart murmur   . Hyperlipidemia    has  favorable LDL particle size and low insulin resistance score.  Marland Kitchen LGSIL (low grade squamous intraepithelial dysplasia)   . MVP (mitral valve prolapse)    No antibiotics required  . Palpitations   . PVC's (premature ventricular contractions)   . Shingles 10-22-15   torso   Past Surgical History:  Procedure Laterality Date  . APPENDECTOMY    . AUGMENTATION MAMMAPLASTY Bilateral   . BLEPHAROPLASTY Bilateral 10/2015    ---[facility]  . CERVICAL BIOPSY  W/ LOOP ELECTRODE EXCISION  2006?   Had pap q 3 months all normal   . CESAREAN SECTION    . COLPOSCOPY    . TONSILLECTOMY AND ADENOIDECTOMY    . US ECHOCARDIOGRAPHY  09/03/2006   EF 55-60%     Current Meds  Medication Sig  . Calcium Carbonate-Vitamin D (CALCIUM 600 + D PO) Take 650 mg by mouth daily. ADORA  . cholecalciferol (VITAMIN D) 1000 UNITS tablet Take 1,000 Units by mouth daily.    . Coenzyme Q10 (COQ10 PO) Take 200 mg by mouth daily.   . Cyanocobalamin (VITAMIN B 12 PO) Take 1,000 mcg by mouth daily.  Marland Kitchen estradiol (ESTRING) 2 MG vaginal ring Place 2 mg vaginally every 3 (three) months. follow package directions  . metoprolol succinate (TOPROL-XL) 25 MG 24 hr tablet Take 1 tablet (25 mg total) by mouth 2 (two) times daily. Please keep upcoming appt in June for future refills. Thank you  . Omega-3 Fatty Acids (OMEGA-3 FISH OIL) 1200 MG CAPS Take 1 capsule by mouth daily.  . Probiotic Product (SOLUBLE FIBER/PROBIOTICS PO) Take 1 capsule by mouth daily.      Allergies:   Patient has no known allergies.   Social History   Tobacco Use  . Smoking status: Never Smoker  . Smokeless tobacco: Never Used  Substance Use Topics  . Alcohol use: Yes    Alcohol/week: 2.0 standard drinks    Types: 2 Standard drinks or equivalent per week    Comment: socially  . Drug use: No     Family Hx: The patient's family history includes Breast cancer in her paternal grandmother and sister; Dementia in her mother; Diabetes in her sister; Heart attack in her maternal grandfather; Heart failure in her father; Hypertension in her brother, father, and mother.  ROS:   Please see the history of present illness.    More palpitations All other systems reviewed and are negative.   Prior CV studies:   The following studies were reviewed today:  EF 55-60, MVP in 2016.  Labs/Other Tests and Data Reviewed:    EKG:  An ECG dated 6/19 was personally reviewed today and  demonstrated:  NSR, no T changes  Recent Labs: No results found for requested labs within last 8760 hours.   Recent Lipid Panel Lab Results  Component Value Date/Time   CHOL 218 (H) 10/03/2016 08:10 AM   TRIG 57 10/03/2016 08:10 AM   HDL 79 10/03/2016 08:10 AM   CHOLHDL 2.6 12/09/2015 07:40 AM   LDLCALC 139 (H) 12/09/2015 07:40 AM   LDLDIRECT 154.1 02/28/2013 08:09 AM    Wt Readings from Last 3 Encounters:  10/28/18 165 lb (74.8 kg)  09/30/18 172 lb (78 kg)  03/20/18 170 lb 12.8 oz (77.5 kg)     Objective:    Vital Signs:  BP 105/70   Pulse 66   Ht 6' (1.829 m)   Wt 165 lb (74.8 kg)   LMP 05/15/2005 (Approximate)   BMI 22.38 kg/m  VITAL SIGNS:  reviewed GEN:  no acute distress RESPIRATORY:  normal respiratory effort, symmetric expansion PSYCH:  normal affect exam limited by video format  ASSESSMENT & PLAN:    1. PACs, PVCs: OK to use metoprolol BID and one dose as needed for extra.  Let us know if she is having more sx.  Would have to consider EP referral if she did not respond to increased metoprolol.  2. Hyperlipidemia: High HDL.  Recheck lipids in 11/19. Consider calcium scoring CT Certainly, if her percentile was low, would just continue with lifestyle changes.  IF > 75%ile, would have to strongly consider statin therapy.  SHe is willing.  She has never tried a statin before.  3. MVP: No sx of CHF.  COVID-19 Education: The signs and symptoms of COVID-19 were discussed with the patient and how to seek care for testing (follow up with PCP or arrange E-visit).  The importance of social distancing was discussed today.  Time:   Today, I have spent 20 minutes with the patient with telehealth technology discussing the above problems.     Medication Adjustments/Labs and Tests Ordered: Current medicines are reviewed at length with the patient today.  Concerns regarding medicines are outlined above.   Tests Ordered: No orders of the defined types were placed in this  encounter.   Medication Changes: No orders of the defined types were placed in this encounter.   Follow Up:  Virtual Visit or In Person in 1 year(s)  Signed, Larae Grooms, MD  10/28/2018 3:16 PM    [place]

## 2019-01-01 ENCOUNTER — Other Ambulatory Visit: Payer: Self-pay | Admitting: Family Medicine

## 2019-01-01 DIAGNOSIS — Z1231 Encounter for screening mammogram for malignant neoplasm of breast: Secondary | ICD-10-CM

## 2019-02-13 ENCOUNTER — Other Ambulatory Visit: Payer: Self-pay

## 2019-02-13 ENCOUNTER — Ambulatory Visit
Admission: RE | Admit: 2019-02-13 | Discharge: 2019-02-13 | Disposition: A | Discharge status: Home / Self Care | Payer: BC Managed Care – PPO | Source: Ambulatory Visit | Attending: Family Medicine | Admitting: Family Medicine

## 2019-02-13 DIAGNOSIS — Z1231 Encounter for screening mammogram for malignant neoplasm of breast: Secondary | ICD-10-CM

## 2019-02-13 DIAGNOSIS — Z23 Encounter for immunization: Secondary | ICD-10-CM | POA: Diagnosis not present

## 2019-03-14 ENCOUNTER — Other Ambulatory Visit: Payer: Self-pay | Admitting: Interventional Cardiology

## 2019-03-20 ENCOUNTER — Other Ambulatory Visit: Payer: BC Managed Care – PPO | Admitting: *Deleted

## 2019-03-20 ENCOUNTER — Other Ambulatory Visit: Payer: Self-pay

## 2019-03-20 DIAGNOSIS — E782 Mixed hyperlipidemia: Secondary | ICD-10-CM

## 2019-03-21 LAB — COMPREHENSIVE METABOLIC PANEL
ALT: 20 IU/L (ref 0–32)
AST: 19 IU/L (ref 0–40)
Albumin/Globulin Ratio: 2.2 (ref 1.2–2.2)
Albumin: 4.3 g/dL (ref 3.8–4.8)
Alkaline Phosphatase: 87 IU/L (ref 39–117)
BUN/Creatinine Ratio: 20 (ref 12–28)
BUN: 17 mg/dL (ref 8–27)
Bilirubin Total: 0.4 mg/dL (ref 0.0–1.2)
CO2: 24 mmol/L (ref 20–29)
Calcium: 9.8 mg/dL (ref 8.7–10.3)
Chloride: 104 mmol/L (ref 96–106)
Creatinine, Ser: 0.86 mg/dL (ref 0.57–1.00)
GFR calc Af Amer: 82 mL/min/{1.73_m2} (ref >59)
GFR calc non Af Amer: 71 mL/min/{1.73_m2} (ref >59)
Globulin, Total: 2 g/dL (ref 1.5–4.5)
Glucose: 90 mg/dL (ref 65–99)
Potassium: 4.4 mmol/L (ref 3.5–5.2)
Sodium: 140 mmol/L (ref 134–144)
Total Protein: 6.3 g/dL (ref 6.0–8.5)

## 2019-03-21 LAB — LIPID PANEL
Chol/HDL Ratio: 3.4 ratio (ref 0.0–4.4)
Cholesterol, Total: 250 mg/dL — ABNORMAL HIGH (ref 100–199)
HDL: 73 mg/dL (ref >39)
LDL Chol Calc (NIH): 166 mg/dL — ABNORMAL HIGH (ref 0–99)
Triglycerides: 69 mg/dL (ref 0–149)
VLDL Cholesterol Cal: 11 mg/dL (ref 5–40)

## 2019-03-24 ENCOUNTER — Ambulatory Visit (INDEPENDENT_AMBULATORY_CARE_PROVIDER_SITE_OTHER): Payer: BC Managed Care – PPO | Admitting: Obstetrics and Gynecology

## 2019-03-24 ENCOUNTER — Other Ambulatory Visit: Payer: Self-pay

## 2019-03-24 ENCOUNTER — Encounter: Payer: Self-pay | Admitting: Obstetrics and Gynecology

## 2019-03-24 VITALS — BP 118/78 | HR 56 | Temp 96.6°F | Resp 16 | Ht 65.5 in | Wt 171.0 lb

## 2019-03-24 DIAGNOSIS — Z01419 Encounter for gynecological examination (general) (routine) without abnormal findings: Secondary | ICD-10-CM

## 2019-03-24 MED ORDER — ESTRING 2 MG VA RING: 2.0000 mg | VAGINAL_RING | VAGINAL | 3 refills | Status: DC

## 2019-03-24 NOTE — Patient Instructions (Signed)

## 2019-03-24 NOTE — Progress Notes (Signed)
63 y.o. G78P2003 Married Caucasian female here for annual exam.    Has a UTI every couple of years.   Using Estring.  Likes it.   She has an appointment with her new PCP.   PCP: Kelton Pillar, MD  Patient's last menstrual period was 05/15/2005 (approximate).           Sexually active: Yes.    The current method of family planning is post menopausal status.    Exercising: Yes.    Pilates 2x/week, walking and treadmill.  Bike rides.  Smoker:  no  Health Maintenance: Pap: 09-30-18 Neg:Neg HR HPV, 03-20-18 ASCUS:Neg HR HPV, 03-12-17 Neg:Neg HR HPV History of abnormal Pap:  Yes, Hx of LEEP with Dr. Cherylann Banas ~2009, had pap q 3 months for a year all paps were normal  MMG: 02-13-19 3D/Neg/density B/BiRads1 Colonoscopy: 02/24/2010 Normal f/u in 2021.  BMD: 04-22-18  Result : Normal TDaP: 2012 Gardasil:   no [id number] donor in past Hep C:done with PCP Screening Labs:  PCP Flu vaccine:  Completed.  Shingrix:  Completed.    reports that she has never smoked. She has never used smokeless tobacco. She reports current alcohol use of about 2.0 standard drinks of alcohol per week. She reports that she does not use drugs.  Past Medical History:  Diagnosis Date  . Bartholin's gland abscess   . Heart murmur   . Hyperlipidemia    has favorable LDL particle size and low insulin resistance score.  Marland Kitchen LGSIL (low grade squamous intraepithelial dysplasia)   . MVP (mitral valve prolapse)    No antibiotics required  . Palpitations   . PVC's (premature ventricular contractions)   . Shingles 10-22-15   torso    Past Surgical History:  Procedure Laterality Date  . APPENDECTOMY    . AUGMENTATION MAMMAPLASTY Bilateral   . BLEPHAROPLASTY Bilateral 10/2015   ---[facility]  . CERVICAL BIOPSY  W/ LOOP ELECTRODE EXCISION  2006?   Had pap q 3 months all normal   . CESAREAN SECTION    . COLPOSCOPY    . TONSILLECTOMY AND ADENOIDECTOMY    . US ECHOCARDIOGRAPHY  09/03/2006   EF 55-60%    Current Outpatient  Medications  Medication Sig Dispense Refill  . Calcium Carbonate-Vitamin D (CALCIUM 600 + D PO) Take 650 mg by mouth daily. ADORA    . cholecalciferol (VITAMIN D) 1000 UNITS tablet Take 1,000 Units by mouth daily.      . Coenzyme Q10 (COQ10 PO) Take 200 mg by mouth daily.     . Cyanocobalamin (VITAMIN B 12 PO) Take 1,000 mcg by mouth daily.    Marland Kitchen estradiol (ESTRING) 2 MG vaginal ring Place 2 mg vaginally every 3 (three) months. follow package directions 1 each 2  . metoprolol succinate (TOPROL-XL) 25 MG 24 hr tablet TAKE ONE TABLET BY MOUTH TWICE DAILY  180 tablet 2  . Omega-3 Fatty Acids (OMEGA-3 FISH OIL) 1200 MG CAPS Take 1 capsule by mouth daily.    . Probiotic Product (SOLUBLE FIBER/PROBIOTICS PO) Take 1 capsule by mouth daily.      No current facility-administered medications for this visit.     Family History  Problem Relation Age of Onset  . Heart failure Father   . Hypertension Father   . Hypertension Mother   . Dementia Mother   . Diabetes Sister   . Breast cancer Sister        Age 73  . Hypertension Brother   . Breast cancer Paternal Grandmother  Age 10's  . Heart attack Maternal Grandfather     Review of Systems  All other systems reviewed and are negative.   Exam:   BP 118/78   Pulse (!) 56   Temp (!) 96.6 F (35.9 C) (Temporal)   Resp 16   Ht 5' 5.5" (1.664 m)   Wt 171 lb (77.6 kg)   LMP 05/15/2005 (Approximate)   BMI 28.02 kg/m     General appearance: alert, cooperative and appears stated age Head: normocephalic, without obvious abnormality, atraumatic Neck: no adenopathy, supple, symmetrical, trachea midline and thyroid normal to inspection and palpation Lungs: clear to auscultation bilaterally Breasts: bilateral implants.  Normal appearance, no masses or tenderness, No nipple retraction or dimpling, No nipple discharge or bleeding, No axillary adenopathy Heart: regular rate and rhythm Abdomen: soft, non-tender; no masses, no  organomegaly Extremities: extremities normal, atraumatic, no cyanosis or edema Skin: skin color, texture, turgor normal. No rashes or lesions Lymph nodes: cervical, supraclavicular, and axillary nodes normal. Neurologic: grossly normal  Pelvic: External genitalia: outline of erythema of the vulva and perianal region.              No abnormal inguinal nodes palpated.              Urethra:  normal appearing urethra with no masses, tenderness or lesions              Bartholins and Skenes: normal                 Vagina: normal appearing vagina with normal color and discharge, no lesions              Cervix: no lesions              Pap taken: No. Bimanual Exam:  Uterus:  normal size, contour, position, consistency, mobility, non-tender              Adnexa: no mass, fullness, tenderness              Rectal exam: Yes.  .  Confirms.              Anus:  normal sphincter tone, no lesions  Estring removed and replaced after exam complete.  Chaperone was present for exam.  Assessment:   Well woman visit with normal exam. Bilateral implants.  Hx LEEP.  Hx positive HR HPV in 2015, with negative follow up Hr HPV testing.   Hx mild GSI. Vaginal atrophy.  Vulvitis.  Likely from bike seat.   Plan: Mammogram screening discussed. Self breast awareness reviewed. Pap and HR HPV in 2025 by current protocol. Guidelines for Calcium, Vitamin D, regular exercise program including cardiovascular and weight bearing exercise. Refill of Estring for one year.  I discussed potential effect on breast cancer.  Follow up annually and prn.   After visit summary provided.

## 2019-04-03 DIAGNOSIS — Z0000 Encounter for general adult medical examination without abnormal findings: Secondary | ICD-10-CM | POA: Diagnosis not present

## 2019-04-03 DIAGNOSIS — Z23 Encounter for immunization: Secondary | ICD-10-CM | POA: Diagnosis not present

## 2019-05-26 DIAGNOSIS — M25561 Pain in right knee: Secondary | ICD-10-CM | POA: Diagnosis not present

## 2019-05-27 ENCOUNTER — Other Ambulatory Visit: Payer: Self-pay | Admitting: Orthopaedic Surgery

## 2019-05-29 ENCOUNTER — Other Ambulatory Visit: Payer: Self-pay | Admitting: Orthopaedic Surgery

## 2019-05-29 DIAGNOSIS — M25561 Pain in right knee: Secondary | ICD-10-CM

## 2019-05-30 ENCOUNTER — Ambulatory Visit
Admission: RE | Admit: 2019-05-30 | Discharge: 2019-05-30 | Disposition: A | Discharge status: Home / Self Care | Payer: BC Managed Care – PPO | Source: Ambulatory Visit | Attending: Orthopaedic Surgery | Admitting: Orthopaedic Surgery

## 2019-05-30 ENCOUNTER — Other Ambulatory Visit: Payer: Self-pay

## 2019-05-30 DIAGNOSIS — M23321 Other meniscus derangements, posterior horn of medial meniscus, right knee: Secondary | ICD-10-CM | POA: Diagnosis not present

## 2019-05-30 DIAGNOSIS — M25561 Pain in right knee: Secondary | ICD-10-CM

## 2019-06-02 ENCOUNTER — Other Ambulatory Visit: Payer: Self-pay | Admitting: Orthopaedic Surgery

## 2019-06-02 DIAGNOSIS — M25561 Pain in right knee: Secondary | ICD-10-CM | POA: Diagnosis not present

## 2019-06-05 ENCOUNTER — Ambulatory Visit: Payer: BC Managed Care – PPO | Attending: Internal Medicine

## 2019-06-05 DIAGNOSIS — Z23 Encounter for immunization: Secondary | ICD-10-CM | POA: Insufficient documentation

## 2019-06-05 NOTE — Progress Notes (Signed)
   Covid-19 Vaccination Clinic  Name:  Virginia Tate    MRN: [id number] DOB: Mar 17, 1956  06/05/2019  Virginia Tate was observed post Covid-19 immunization for 15 minutes without incidence. She was provided with Vaccine Information Sheet and instruction to access the V-Safe system.   Virginia Tate was instructed to call 911 with any severe reactions post vaccine: Marland Kitchen Difficulty breathing  . Swelling of your face and throat  . A fast heartbeat  . A bad rash all over your body  . Dizziness and weakness    Immunizations Administered    Name Date Dose VIS Date Route   Pfizer COVID-19 Vaccine 06/05/2019 12:05 PM 0.3 mL 04/25/2019 Intramuscular   Manufacturer: [place]   Lot: [id number]   [place]: [id number]

## 2019-06-10 ENCOUNTER — Other Ambulatory Visit: Payer: Self-pay

## 2019-06-10 ENCOUNTER — Encounter (HOSPITAL_BASED_OUTPATIENT_CLINIC_OR_DEPARTMENT_OTHER): Payer: Self-pay | Admitting: Orthopaedic Surgery

## 2019-06-13 ENCOUNTER — Other Ambulatory Visit: Payer: Self-pay

## 2019-06-13 ENCOUNTER — Encounter (HOSPITAL_BASED_OUTPATIENT_CLINIC_OR_DEPARTMENT_OTHER)
Admission: RE | Admit: 2019-06-13 | Discharge: 2019-06-13 | Disposition: A | Discharge status: Home / Self Care | Payer: BC Managed Care – PPO | Source: Ambulatory Visit | Attending: Orthopaedic Surgery | Admitting: Orthopaedic Surgery

## 2019-06-13 ENCOUNTER — Other Ambulatory Visit (HOSPITAL_COMMUNITY)
Admission: RE | Admit: 2019-06-13 | Discharge: 2019-06-13 | Disposition: A | Discharge status: Home / Self Care | Payer: BC Managed Care – PPO | Source: Ambulatory Visit | Attending: Orthopaedic Surgery | Admitting: Orthopaedic Surgery

## 2019-06-13 DIAGNOSIS — R001 Bradycardia, unspecified: Secondary | ICD-10-CM | POA: Insufficient documentation

## 2019-06-13 DIAGNOSIS — Z20822 Contact with and (suspected) exposure to covid-19: Secondary | ICD-10-CM | POA: Diagnosis not present

## 2019-06-13 DIAGNOSIS — R9431 Abnormal electrocardiogram [ECG] [EKG]: Secondary | ICD-10-CM | POA: Insufficient documentation

## 2019-06-13 DIAGNOSIS — Z01818 Encounter for other preprocedural examination: Secondary | ICD-10-CM | POA: Diagnosis not present

## 2019-06-13 LAB — SARS CORONAVIRUS 2 (TAT 6-24 HRS): SARS Coronavirus 2: NEGATIVE

## 2019-06-13 NOTE — Progress Notes (Signed)
EKG reviewed by Dr. Therisa Doyne, will proceed with surgery as scheduled.

## 2019-06-13 NOTE — Progress Notes (Signed)

## 2019-06-16 NOTE — H&P (Signed)
Virginia Tate is an 64 y.o. female.   Chief Complaint: right knee pain HPI: Virginia Tate is here to review her MRI scan.  She continues with some terrible right knee pain on the inside aspect.  She has had trouble for more than 3 months since a dog walk related twisting injury.    MRI:  I reviewed an MRI scan films and report of a study done at [facility] on 05/30/19.  This shows a tear of the posterior horn of the medial meniscus extending to the inferior articular surface.  She has some patellofemoral degeneration focal and moderate.  Past Medical History:  Diagnosis Date  . Bartholin's gland abscess   . Heart murmur   . Hyperlipidemia    has favorable LDL particle size and low insulin resistance score.  Marland Kitchen LGSIL (low grade squamous intraepithelial dysplasia)   . MVP (mitral valve prolapse)    No antibiotics required  . Palpitations   . PVC's (premature ventricular contractions)   . Shingles 10-22-15   torso    Past Surgical History:  Procedure Laterality Date  . APPENDECTOMY    . AUGMENTATION MAMMAPLASTY Bilateral   . BLEPHAROPLASTY Bilateral 10/2015   ---[facility]  . CERVICAL BIOPSY  W/ LOOP ELECTRODE EXCISION  2006?   Had pap q 3 months all normal   . CESAREAN SECTION    . COLPOSCOPY    . TONSILLECTOMY AND ADENOIDECTOMY    . US ECHOCARDIOGRAPHY  09/03/2006   EF 55-60%    Family History  Problem Relation Age of Onset  . Heart failure Father   . Hypertension Father   . Hypertension Mother   . Dementia Mother   . Diabetes Sister   . Breast cancer Sister        Age 2  . Hypertension Brother   . Breast cancer Paternal Grandmother        Age 44's  . Heart attack Maternal Grandfather    Social History:  reports that she has never smoked. She has never used smokeless tobacco. She reports current alcohol use of about 2.0 standard drinks of alcohol per week. She reports that she does not use drugs.  Allergies: No Known Allergies  No medications prior to admission.    No  results found for this or any previous visit (from the past 48 hour(s)). No results found.  Review of Systems  Musculoskeletal: Positive for arthralgias.       Right knee  All other systems reviewed and are negative.   Height 5\' 7"  (1.702 m), weight 75.8 kg, last menstrual period 05/15/2005. Physical Exam  Constitutional: She is oriented to person, place, and time. She appears well-developed and well-nourished.  HENT:  Head: Normocephalic and atraumatic.  Eyes: Pupils are equal, round, and reactive to light.  Cardiovascular: Normal rate and regular rhythm.  Respiratory: Effort normal.  GI: Soft.  Musculoskeletal:     Cervical back: Normal range of motion.     Comments: Her right knee persists with trace effusion.  She has intense medial joint line pain and a McMurray's test which is positive for pain but no pop.  Ligaments are stable.  There is no crepitation.  Hip motion is full.  Sensation and motor function are intact in her feet with palpable pulses on both sides.   Neurological: She is alert and oriented to person, place, and time.  Skin: Skin is warm and dry.  Psychiatric: She has a normal mood and affect. Her behavior is normal. Judgment and  thought content normal.     Assessment/Plan Assessment:  Right knee torn medial meniscus by MRI 2021  Plan: Virginia Tate has a torn medial meniscus by MRI with a paucity of degenerative change.  She has been symptomatic for more than 3 months since a twisting injury.  She persists with pain and a mechanical catch.  I think we can help her with a knee arthroscopy. I reviewed risks of anesthesia and infection as well as potential for DVT related to a knee arthroscopy.  I've stressed the importance of some postoperative physical therapy to optimize results and we will try to set up an appointment.  Two to four  weeks for recovery would be typical but that is a little variable.    Larwance Sachs Zaylyn Bergdoll, PA-C 06/16/2019, 10:02 AM

## 2019-06-17 ENCOUNTER — Encounter (HOSPITAL_BASED_OUTPATIENT_CLINIC_OR_DEPARTMENT_OTHER)
Admission: RE | Disposition: A | Discharge status: Home / Self Care | Payer: Self-pay | Source: Home / Self Care | Attending: Orthopaedic Surgery

## 2019-06-17 ENCOUNTER — Ambulatory Visit (HOSPITAL_BASED_OUTPATIENT_CLINIC_OR_DEPARTMENT_OTHER)
Admission: RE | Admit: 2019-06-17 | Discharge: 2019-06-17 | Disposition: A | Discharge status: Home / Self Care | Payer: BC Managed Care – PPO | Attending: Orthopaedic Surgery | Admitting: Orthopaedic Surgery

## 2019-06-17 ENCOUNTER — Ambulatory Visit (HOSPITAL_BASED_OUTPATIENT_CLINIC_OR_DEPARTMENT_OTHER): Payer: BC Managed Care – PPO | Admitting: Anesthesiology

## 2019-06-17 ENCOUNTER — Encounter (HOSPITAL_BASED_OUTPATIENT_CLINIC_OR_DEPARTMENT_OTHER): Payer: Self-pay | Admitting: Orthopaedic Surgery

## 2019-06-17 ENCOUNTER — Other Ambulatory Visit: Payer: Self-pay

## 2019-06-17 DIAGNOSIS — I493 Ventricular premature depolarization: Secondary | ICD-10-CM | POA: Insufficient documentation

## 2019-06-17 DIAGNOSIS — Y93K1 Activity, walking an animal: Secondary | ICD-10-CM | POA: Insufficient documentation

## 2019-06-17 DIAGNOSIS — S83241A Other tear of medial meniscus, current injury, right knee, initial encounter: Secondary | ICD-10-CM | POA: Diagnosis not present

## 2019-06-17 DIAGNOSIS — I341 Nonrheumatic mitral (valve) prolapse: Secondary | ICD-10-CM | POA: Insufficient documentation

## 2019-06-17 DIAGNOSIS — X501XXA Overexertion from prolonged static or awkward postures, initial encounter: Secondary | ICD-10-CM | POA: Diagnosis not present

## 2019-06-17 DIAGNOSIS — M23221 Derangement of posterior horn of medial meniscus due to old tear or injury, right knee: Secondary | ICD-10-CM | POA: Diagnosis not present

## 2019-06-17 DIAGNOSIS — M94261 Chondromalacia, right knee: Secondary | ICD-10-CM | POA: Diagnosis not present

## 2019-06-17 DIAGNOSIS — E785 Hyperlipidemia, unspecified: Secondary | ICD-10-CM | POA: Diagnosis not present

## 2019-06-17 HISTORY — PX: KNEE ARTHROSCOPY: SHX127

## 2019-06-17 SURGERY — ARTHROSCOPY, KNEE
Anesthesia: General | Site: Knee | Laterality: Right

## 2019-06-17 MED ORDER — FENTANYL CITRATE (PF) 100 MCG/2ML IJ SOLN
INTRAMUSCULAR | Status: AC
  Filled 2019-06-17: qty 2

## 2019-06-17 MED ORDER — HYDROMORPHONE HCL 1 MG/ML IJ SOLN
INTRAMUSCULAR | Status: AC
  Filled 2019-06-17: qty 0.5

## 2019-06-17 MED ORDER — ONDANSETRON HCL 4 MG/2ML IJ SOLN
INTRAMUSCULAR | Status: AC
  Filled 2019-06-17: qty 2

## 2019-06-17 MED ORDER — MIDAZOLAM HCL 2 MG/2ML IJ SOLN
INTRAMUSCULAR | Status: AC
  Filled 2019-06-17: qty 2

## 2019-06-17 MED ORDER — PROMETHAZINE HCL 25 MG/ML IJ SOLN: 6.2500 mg | INTRAMUSCULAR | Status: DC | PRN

## 2019-06-17 MED ORDER — CEFAZOLIN SODIUM-DEXTROSE 2-4 GM/100ML-% IV SOLN
2.0000 g | INTRAVENOUS | Status: AC
  Administered 2019-06-17: 2 g via INTRAVENOUS

## 2019-06-17 MED ORDER — DEXAMETHASONE SODIUM PHOSPHATE 10 MG/ML IJ SOLN
INTRAMUSCULAR | Status: AC
  Filled 2019-06-17: qty 1

## 2019-06-17 MED ORDER — PROPOFOL 10 MG/ML IV BOLUS
INTRAVENOUS | Status: DC | PRN
  Administered 2019-06-17: 200 mg via INTRAVENOUS

## 2019-06-17 MED ORDER — MEPERIDINE HCL 25 MG/ML IJ SOLN: 6.2500 mg | INTRAMUSCULAR | Status: DC | PRN

## 2019-06-17 MED ORDER — OXYCODONE HCL 5 MG PO TABS: 5.0000 mg | ORAL_TABLET | Freq: Once | ORAL | Status: DC | PRN

## 2019-06-17 MED ORDER — HYDROMORPHONE HCL 1 MG/ML IJ SOLN
0.2500 mg | INTRAMUSCULAR | Status: DC | PRN
  Administered 2019-06-17: 0.5 mg via INTRAVENOUS

## 2019-06-17 MED ORDER — CHLORHEXIDINE GLUCONATE 4 % EX LIQD: 60.0000 mL | Freq: Once | CUTANEOUS | Status: DC

## 2019-06-17 MED ORDER — KETOROLAC TROMETHAMINE 30 MG/ML IJ SOLN: 30.0000 mg | Freq: Once | INTRAMUSCULAR | Status: DC | PRN

## 2019-06-17 MED ORDER — LACTATED RINGERS IV SOLN: INTRAVENOUS | Status: DC

## 2019-06-17 MED ORDER — EPHEDRINE 5 MG/ML INJ
INTRAVENOUS | Status: AC
  Filled 2019-06-17: qty 10

## 2019-06-17 MED ORDER — ACETAMINOPHEN 500 MG PO TABS: 1000.0000 mg | ORAL_TABLET | Freq: Once | ORAL | Status: DC

## 2019-06-17 MED ORDER — EPHEDRINE SULFATE 50 MG/ML IJ SOLN
INTRAMUSCULAR | Status: DC | PRN
  Administered 2019-06-17: 10 mg via INTRAVENOUS

## 2019-06-17 MED ORDER — FENTANYL CITRATE (PF) 100 MCG/2ML IJ SOLN
50.0000 ug | INTRAMUSCULAR | Status: AC | PRN
  Administered 2019-06-17: 08:00:00 50 ug via INTRAVENOUS
  Administered 2019-06-17 (×2): 25 ug via INTRAVENOUS

## 2019-06-17 MED ORDER — OXYCODONE HCL 5 MG/5ML PO SOLN: 5.0000 mg | Freq: Once | ORAL | Status: DC | PRN

## 2019-06-17 MED ORDER — PROPOFOL 10 MG/ML IV BOLUS
INTRAVENOUS | Status: AC
  Filled 2019-06-17: qty 20

## 2019-06-17 MED ORDER — ONDANSETRON HCL 4 MG/2ML IJ SOLN
INTRAMUSCULAR | Status: DC | PRN
  Administered 2019-06-17: 4 mg via INTRAVENOUS

## 2019-06-17 MED ORDER — LIDOCAINE 2% (20 MG/ML) 5 ML SYRINGE
INTRAMUSCULAR | Status: DC | PRN
  Administered 2019-06-17: 80 mg via INTRAVENOUS

## 2019-06-17 MED ORDER — BUPIVACAINE HCL 0.5 % IJ SOLN
INTRAMUSCULAR | Status: DC | PRN
  Administered 2019-06-17: 20 mL

## 2019-06-17 MED ORDER — CEFAZOLIN SODIUM-DEXTROSE 2-4 GM/100ML-% IV SOLN
INTRAVENOUS | Status: AC
  Filled 2019-06-17: qty 100

## 2019-06-17 MED ORDER — DEXAMETHASONE SODIUM PHOSPHATE 10 MG/ML IJ SOLN
INTRAMUSCULAR | Status: DC | PRN
  Administered 2019-06-17: 5 mg via INTRAVENOUS

## 2019-06-17 MED ORDER — HYDROCODONE-ACETAMINOPHEN 5-325 MG PO TABS: 1.0000 | ORAL_TABLET | Freq: Four times a day (QID) | ORAL | 0 refills | Status: DC | PRN

## 2019-06-17 MED ORDER — MIDAZOLAM HCL 2 MG/2ML IJ SOLN
1.0000 mg | INTRAMUSCULAR | Status: DC | PRN
  Administered 2019-06-17: 08:00:00 2 mg via INTRAVENOUS

## 2019-06-17 MED ORDER — SODIUM CHLORIDE 0.9 % IR SOLN
Status: DC | PRN
  Administered 2019-06-17: 2000 mL

## 2019-06-17 MED ORDER — LIDOCAINE 2% (20 MG/ML) 5 ML SYRINGE
INTRAMUSCULAR | Status: AC
  Filled 2019-06-17: qty 5

## 2019-06-17 SURGICAL SUPPLY — 39 items
BLADE EXCALIBUR 4.0X13 (MISCELLANEOUS) IMPLANT
BNDG ELASTIC 6X5.8 VLCR STR LF (GAUZE/BANDAGES/DRESSINGS) ×2 IMPLANT
BNDG GAUZE ELAST 4 BULKY (GAUZE/BANDAGES/DRESSINGS) ×2 IMPLANT
COVER WAND RF STERILE (DRAPES) IMPLANT
DISSECTOR  3.8MM X 13CM (MISCELLANEOUS) ×1
DISSECTOR 3.8MM X 13CM (MISCELLANEOUS) ×1 IMPLANT
DRAPE ARTHROSCOPY W/POUCH 90 (DRAPES) ×2 IMPLANT
DRAPE U-SHAPE 47X51 STRL (DRAPES) ×2 IMPLANT
DRSG EMULSION OIL 3X3 NADH (GAUZE/BANDAGES/DRESSINGS) ×2 IMPLANT
DURAPREP 26ML APPLICATOR (WOUND CARE) ×4 IMPLANT
ELECT MENISCUS 165MM 90D (ELECTRODE) IMPLANT
ELECT REM PT RETURN 9FT ADLT (ELECTROSURGICAL)
ELECTRODE REM PT RTRN 9FT ADLT (ELECTROSURGICAL) IMPLANT
GAUZE SPONGE 4X4 12PLY STRL (GAUZE/BANDAGES/DRESSINGS) ×2 IMPLANT
GLOVE BIO SURGEON STRL SZ7 (GLOVE) ×2 IMPLANT
GLOVE BIO SURGEON STRL SZ8 (GLOVE) ×4 IMPLANT
GLOVE BIOGEL PI IND STRL 7.0 (GLOVE) ×2 IMPLANT
GLOVE BIOGEL PI IND STRL 7.5 (GLOVE) ×1 IMPLANT
GLOVE BIOGEL PI IND STRL 8 (GLOVE) ×2 IMPLANT
GLOVE BIOGEL PI INDICATOR 7.0 (GLOVE) ×2
GLOVE BIOGEL PI INDICATOR 7.5 (GLOVE) ×1
GLOVE BIOGEL PI INDICATOR 8 (GLOVE) ×2
GLOVE SURG SS PI 7.0 STRL IVOR (GLOVE) ×2 IMPLANT
GOWN STRL REUS W/ TWL LRG LVL3 (GOWN DISPOSABLE) ×1 IMPLANT
GOWN STRL REUS W/ TWL XL LVL3 (GOWN DISPOSABLE) ×2 IMPLANT
GOWN STRL REUS W/TWL LRG LVL3 (GOWN DISPOSABLE) ×1
GOWN STRL REUS W/TWL XL LVL3 (GOWN DISPOSABLE) ×2
IV NS IRRIG 3000ML ARTHROMATIC (IV SOLUTION) ×2 IMPLANT
KNEE WRAP E Z 3 GEL PACK (MISCELLANEOUS) ×2 IMPLANT
MANIFOLD NEPTUNE II (INSTRUMENTS) IMPLANT
PACK ARTHROSCOPY DSU (CUSTOM PROCEDURE TRAY) ×2 IMPLANT
PACK BASIN DAY SURGERY FS (CUSTOM PROCEDURE TRAY) ×2 IMPLANT
PENCIL SMOKE EVACUATOR (MISCELLANEOUS) IMPLANT
SHEET MEDIUM DRAPE 40X70 STRL (DRAPES) ×2 IMPLANT
SLEEVE SCD COMPRESS KNEE MED (MISCELLANEOUS) ×2 IMPLANT
SYR 3ML 18GX1 1/2 (SYRINGE) IMPLANT
TOWEL GREEN STERILE FF (TOWEL DISPOSABLE) ×2 IMPLANT
TUBING ARTHROSCOPY IRRIG 16FT (MISCELLANEOUS) ×2 IMPLANT
WATER STERILE IRR 1000ML POUR (IV SOLUTION) IMPLANT

## 2019-06-17 NOTE — Anesthesia Procedure Notes (Signed)
Procedure Name: LMA Insertion Date/Time: 06/17/2019 8:30 AM Performed by: Suan Halter, CRNA Pre-anesthesia Checklist: Patient identified, Emergency Drugs available, Suction available and Patient being monitored Patient Re-evaluated:Patient Re-evaluated prior to induction Oxygen Delivery Method: Circle system utilized Preoxygenation: Pre-oxygenation with 100% oxygen Induction Type: IV induction Ventilation: Mask ventilation without difficulty LMA: LMA inserted LMA Size: 4.0 Number of attempts: 1 Airway Equipment and Method: Bite block Placement Confirmation: positive ETCO2 Tube secured with: Tape Dental Injury: Teeth and Oropharynx as per pre-operative assessment

## 2019-06-17 NOTE — Anesthesia Postprocedure Evaluation (Signed)
Anesthesia Post Note  Patient: Virginia Tate  Procedure(s) Performed: RIGHT KNEE ARTHROSCOPY WITH PARTIAL MEDIAL MENISECTOMY (Right Knee)     Patient location during evaluation: PACU Anesthesia Type: General Level of consciousness: awake and alert, oriented and patient cooperative Pain management: pain level controlled Vital Signs Assessment: post-procedure vital signs reviewed and stable Respiratory status: spontaneous breathing, nonlabored ventilation and respiratory function stable Cardiovascular status: blood pressure returned to baseline and stable Postop Assessment: no apparent nausea or vomiting Anesthetic complications: no    Last Vitals:  Vitals:   06/17/19 0915 06/17/19 0930  BP: 107/65 118/70  Pulse: 63 (!) 56  Resp: 12 16  Temp:    SpO2: 100% 99%    Last Pain:  Vitals:   06/17/19 0930  PainSc: [place]

## 2019-06-17 NOTE — Transfer of Care (Signed)
Immediate Anesthesia Transfer of Care Note  Patient: Virginia Tate  Procedure(s) Performed: Procedure(s) (LRB): RIGHT KNEE ARTHROSCOPY WITH PARTIAL MEDIAL MENISECTOMY (Right)  Patient Location: PACU  Anesthesia Type: General  Level of Consciousness: awake, oriented, sedated and patient cooperative  Airway & Oxygen Therapy: Patient Spontanous Breathing and Patient connected to face mask oxygen  Post-op Assessment: Report given to PACU RN and Post -op Vital signs reviewed and stable  Post vital signs: Reviewed and stable  Complications: No apparent anesthesia complications  Last Vitals:  Vitals Value Taken Time  BP 100/69 06/17/19 0909  Temp    Pulse 55 06/17/19 0912  Resp 14 06/17/19 0912  SpO2 100 % 06/17/19 0912  Vitals shown include unvalidated device data.  Last Pain:  Vitals:   06/17/19 0705  PainSc: 0-No pain

## 2019-06-17 NOTE — Anesthesia Preprocedure Evaluation (Signed)
Anesthesia Evaluation  Patient identified by MRN, date of birth, ID band Patient awake    Reviewed: Allergy & Precautions, NPO status , Patient's Chart, lab work & pertinent test results  Airway Mallampati: II  TM Distance: >3 FB Neck ROM: Full    Dental no notable dental hx.    Pulmonary neg pulmonary ROS,    Pulmonary exam normal breath sounds clear to auscultation       Cardiovascular (-) hypertensionNormal cardiovascular exam+ dysrhythmias + Valvular Problems/Murmurs MVP  Rhythm:Regular Rate:Normal  Palpitations, PVCs- on metoprolol   Neuro/Psych negative neurological ROS  negative psych ROS   GI/Hepatic negative GI ROS, Neg liver ROS,   Endo/Other  negative endocrine ROS  Renal/GU negative Renal ROS  negative genitourinary   Musculoskeletal negative musculoskeletal ROS (+)   Abdominal   Peds negative pediatric ROS (+)  Hematology negative hematology ROS (+)   Anesthesia Other Findings 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  HLD  Reproductive/Obstetrics negative OB ROS                             Anesthesia Physical Anesthesia Plan  ASA: II  Anesthesia Plan: General   Post-op Pain Management:    Induction: Intravenous  PONV Risk Score and Plan: 2 and Ondansetron, Dexamethasone and Treatment may vary due to age or medical condition  Airway Management Planned: LMA  Additional Equipment: None  Intra-op Plan:   Post-operative Plan: Extubation in OR  Informed Consent: I have reviewed the patients History and Physical, chart, labs and discussed the procedure including the risks, benefits and alternatives for the proposed anesthesia with the patient or authorized representative who has indicated his/her understanding and acceptance.     Dental advisory given  Plan Discussed with: CRNA  Anesthesia Plan Comments:         Anesthesia Quick Evaluation

## 2019-06-17 NOTE — Discharge Instructions (Signed)

## 2019-06-17 NOTE — Interval H&P Note (Signed)
History and Physical Interval Note:  06/17/2019 8:11 AM  Virginia Tate  has presented today for surgery, with the diagnosis of RIGHT KNEE MEDIAL MENISCUS [place].  The various methods of treatment have been discussed with the patient and family. After consideration of risks, benefits and other options for treatment, the patient has consented to  Procedure(s): RIGHT KNEE ARTHROSCOPY (Right) as a surgical intervention.  The patient's history has been reviewed, patient examined, no change in status, stable for surgery.  I have reviewed the patient's chart and labs.  Questions were answered to the patient's satisfaction.     Hessie Dibble

## 2019-06-17 NOTE — Op Note (Signed)
Virginia Tate [id number] 06/17/2019   PRE-OP DIAGNOSIS: right knee TMM  POST-OP DIAGNOSIS: same  PROCEDURE: right knee scope  ANESTHESIA: general  Hessie Dibble   Dictation #:  [id number]

## 2019-06-17 NOTE — Op Note (Signed)
NAME: Virginia Tate, Virginia Tate MEDICAL RECORD [id number] ACCOUNT [id number] DATE OF BIRTH:[birth year] FACILITY: [facility] LOCATION: [facility] PHYSICIAN:Aprile Dickenson Darnell Level Dezire Turk, MD  OPERATIVE REPORT  DATE OF PROCEDURE:  06/17/2019  PREOPERATIVE DIAGNOSES:  Right knee torn medial meniscus.  POSTOPERATIVE DIAGNOSIS:  Right knee torn medial meniscus.  PROCEDURE:  Right knee partial medial meniscectomy.  ANESTHESIA:  General.  ATTENDING SURGEON:   Melrose Nakayama, MD  ASSISTANT:  Clarene Critchley, PA.  INDICATIONS:  The patient is a 64 year old woman with a long history of right knee pain.  This has persisted despite various conservative measures.  By MRI scan, she has a posterior horn medial meniscus tear with a paucity of degenerative change.  She  has pain which limits her ability to walk and rest and she is offered an arthroscopy.  Informed operative consent was obtained after discussion of possible complications, including reaction to anesthesia and infection.  SUMMARY OF FINDINGS AND PROCEDURE:  Under general anesthesia, an arthroscopy of the right knee was performed.  Suprapatellar pouch was benign, as was the patellofemoral joint.  Medial compartment exhibited an undersurface tear of the posterior horn of  the medial meniscus consistent with her MRI scan.  A 5% partial medial meniscectomy was done.  She had no significant degenerative change in this compartment.  ACL looked normal and the lateral compartment was completely benign.  She was scheduled to go  home the same day.  DESCRIPTION OF PROCEDURE:  The patient was taken to the operating suite where general anesthetic was applied without difficulty.  She was positioned supine and prepped and draped in normal sterile fashion.  After the administration of preoperative IV  Kefzol and appropriate time-out, an arthroscopy of the right knee was performed through a total of 2 portals.  Findings were as noted above and procedure consisted of the partial  medial meniscectomy, done with a basket and shaver, removing about 5% of  this structure back to a stable rim.  Otherwise, her knee was benign.  She was irrigated, followed by removal of arthroscopic equipment.  Adaptic was placed over the portals, followed by dry gauze and a loose Ace wrap.  Estimated blood loss and intraoperative fluids can be obtained from anesthesia records.  DISPOSITION:  The patient was extubated in the operating room and taken to the recovery room in stable condition.  She was to go home same day and follow up in the office in less than a week.  I will contact her by phone tonight.  VN/NUANCE  D:06/17/2019 T:06/17/2019 JOB:[id number]

## 2019-06-18 ENCOUNTER — Encounter: Payer: Self-pay | Admitting: *Deleted

## 2019-06-26 ENCOUNTER — Ambulatory Visit: Payer: BC Managed Care – PPO | Attending: Internal Medicine

## 2019-06-26 DIAGNOSIS — Z9889 Other specified postprocedural states: Secondary | ICD-10-CM | POA: Diagnosis not present

## 2019-06-26 DIAGNOSIS — Z23 Encounter for immunization: Secondary | ICD-10-CM | POA: Insufficient documentation

## 2019-06-26 DIAGNOSIS — M25661 Stiffness of right knee, not elsewhere classified: Secondary | ICD-10-CM | POA: Diagnosis not present

## 2019-06-26 DIAGNOSIS — M6281 Muscle weakness (generalized): Secondary | ICD-10-CM | POA: Diagnosis not present

## 2019-06-26 NOTE — Progress Notes (Signed)
   Covid-19 Vaccination Clinic  Name:  Virginia Tate    MRN: [id number] DOB: 25-Aug-1955  06/26/2019  Virginia Tate was observed post Covid-19 immunization for 15 minutes without incidence. She was provided with Vaccine Information Sheet and instruction to access the V-Safe system.   Virginia Tate was instructed to call 911 with any severe reactions post vaccine: Marland Kitchen Difficulty breathing  . Swelling of your face and throat  . A fast heartbeat  . A bad rash all over your body  . Dizziness and weakness    Immunizations Administered    Name Date Dose VIS Date Route   Pfizer COVID-19 Vaccine 06/26/2019 12:55 PM 0.3 mL 04/25/2019 Intramuscular   Manufacturer: [place]   Lot: [id number]   [place]: [id number]

## 2019-07-02 DIAGNOSIS — M25661 Stiffness of right knee, not elsewhere classified: Secondary | ICD-10-CM | POA: Diagnosis not present

## 2019-07-02 DIAGNOSIS — M6281 Muscle weakness (generalized): Secondary | ICD-10-CM | POA: Diagnosis not present

## 2019-07-02 DIAGNOSIS — Z9889 Other specified postprocedural states: Secondary | ICD-10-CM | POA: Diagnosis not present

## 2019-07-04 DIAGNOSIS — Z9889 Other specified postprocedural states: Secondary | ICD-10-CM | POA: Diagnosis not present

## 2019-07-04 DIAGNOSIS — M6281 Muscle weakness (generalized): Secondary | ICD-10-CM | POA: Diagnosis not present

## 2019-07-04 DIAGNOSIS — M25661 Stiffness of right knee, not elsewhere classified: Secondary | ICD-10-CM | POA: Diagnosis not present

## 2019-07-08 DIAGNOSIS — M6281 Muscle weakness (generalized): Secondary | ICD-10-CM | POA: Diagnosis not present

## 2019-07-08 DIAGNOSIS — Z9889 Other specified postprocedural states: Secondary | ICD-10-CM | POA: Diagnosis not present

## 2019-07-08 DIAGNOSIS — M25661 Stiffness of right knee, not elsewhere classified: Secondary | ICD-10-CM | POA: Diagnosis not present

## 2019-07-10 DIAGNOSIS — M25661 Stiffness of right knee, not elsewhere classified: Secondary | ICD-10-CM | POA: Diagnosis not present

## 2019-07-10 DIAGNOSIS — Z9889 Other specified postprocedural states: Secondary | ICD-10-CM | POA: Diagnosis not present

## 2019-07-10 DIAGNOSIS — M6281 Muscle weakness (generalized): Secondary | ICD-10-CM | POA: Diagnosis not present

## 2019-07-15 DIAGNOSIS — L821 Other seborrheic keratosis: Secondary | ICD-10-CM | POA: Diagnosis not present

## 2019-07-15 DIAGNOSIS — L570 Actinic keratosis: Secondary | ICD-10-CM | POA: Diagnosis not present

## 2019-09-02 ENCOUNTER — Other Ambulatory Visit: Payer: Self-pay | Admitting: Interventional Cardiology

## 2019-11-02 NOTE — Progress Notes (Signed)
Cardiology Office Note   Date:  11/03/2019   ID:  Virginia Tate, DOB [birth year], MRN [id number]  PCP:  Virginia Pillar, Tate    No chief complaint on file.  Hyperlipidemia; PACs, PVCs  Wt Readings from Last 3 Encounters:  11/03/19 166 lb 9.6 oz (75.6 kg)  06/17/19 169 lb 5 oz (76.8 kg)  03/24/19 171 lb (77.6 kg)       History of Present Illness: Virginia Tate is a 64 y.o. female   who has a past history of mild mitral valve prolapse. She was seen by Virginia Tate for many years. An echocardiogram in 2008 which showed a small degree of mitral valve prolapse and trace mitral regurgitation. She has a past history of PVCs. She has a past history of hyperlipidemia but because of favorable LDL particle size and low insulin resistance score she has not had to go on any statin therapy. SHe hasahigh HDL as well.  Because of her increased palpitations she underwent an 2-D echocardiogram on 07/29/14 which showed normal left ventricular systolic function with ejection fraction 60-65%. There was trivial mitral regurgitation.. 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  She had a 48-hour Holter monitorin 2017which did not show anysignificant arrhythmias. She was in normal sinus rhythm. There was no atrial fibrillation. She had scattered premature atrial beats and premature ventricular beats. Some of the premature beats were interpolated. The patient previously had been on nadolol which lost efficacy. We switched her to metoprolol succinate. She initially was on 25 mg daily and we have increased it to 50 mg daily.   On this regimen shedidfairly well with just occasional palpitations which didnot bother her much.Instead of taking 50 mg every morning, she was switched to 25 mg twice a day.  Lipids were elevated in 5/17 at [facility] we decided to consider once a week statin. She had a 2016 renal stone CT and heart was partially visualized by her husband and  there was no evidence of coronary calcium.  THerefore, we have not treated with a statin.  We discussed a coronary CT calcium scoring scan.    She is neighbors with Virginia Tate. She had knee surgery from torn cartilage in 2/21.    Denies : Chest pain. Dizziness. Leg edema. Nitroglycerin use. Orthopnea. Palpitations. Paroxysmal nocturnal dyspnea. Shortness of breath. Syncope.   She got her COVID vaccines.      Past Medical History:  Diagnosis Date  . Bartholin's gland abscess   . Heart murmur   . Hyperlipidemia    has favorable LDL particle size and low insulin resistance score.  Marland Kitchen LGSIL (low grade squamous intraepithelial dysplasia)   . MVP (mitral valve prolapse)    No antibiotics required  . Palpitations   . PVC's (premature ventricular contractions)   . Shingles 10-22-15   torso    Past Surgical History:  Procedure Laterality Date  . APPENDECTOMY    . AUGMENTATION MAMMAPLASTY Bilateral   . BLEPHAROPLASTY Bilateral 10/2015   ---[facility]  . CERVICAL BIOPSY  W/ LOOP ELECTRODE EXCISION  2006?   Had pap q 3 months all normal   . CESAREAN SECTION    . COLPOSCOPY    . KNEE ARTHROSCOPY Right 06/17/2019   Procedure: RIGHT KNEE ARTHROSCOPY WITH PARTIAL MEDIAL MENISECTOMY;  Surgeon: Virginia Tate;  Location: [place];  Service: Orthopedics;  Laterality: Right;  . TONSILLECTOMY AND ADENOIDECTOMY    . US ECHOCARDIOGRAPHY  09/03/2006   EF 55-60%  Current Outpatient Medications  Medication Sig Dispense Refill  . Calcium Carbonate-Vitamin D (CALCIUM 600 + D PO) Take 650 mg by mouth daily. ADORA    . cholecalciferol (VITAMIN D) 1000 UNITS tablet Take 1,000 Units by mouth daily.      . Coenzyme Q10 (COQ10 PO) Take 200 mg by mouth daily.     . Cyanocobalamin (VITAMIN B 12 PO) Take 1,000 mcg by mouth daily.    Marland Kitchen estradiol (ESTRING) 2 MG vaginal ring Place 2 mg vaginally every 3 (three) months. follow package directions 1 each 3  . Magnesium (V-R MAGNESIUM) 250  MG TABS Take 250 mg by mouth daily.    . metoprolol succinate (TOPROL-XL) 25 MG 24 hr tablet TAKE ONE TABLET BY MOUTH TWICE DAILY  180 tablet 0  . Omega-3 Fatty Acids (OMEGA-3 FISH OIL) 1200 MG CAPS Take 1 capsule by mouth daily.    . Probiotic Product (SOLUBLE FIBER/PROBIOTICS PO) Take 1 capsule by mouth daily.      No current facility-administered medications for this visit.    Allergies:   Patient has no known allergies.    Social History:  The patient  reports that she has never smoked. She has never used smokeless tobacco. She reports current alcohol use of about 2.0 standard drinks of alcohol per week. She reports that she does not use drugs.   Family History:  The patient's family history includes Breast cancer in her paternal grandmother and sister; Dementia in her mother; Diabetes in her sister; Heart attack in her maternal grandfather; Heart failure in her father; Hypertension in her brother, father, and mother.    ROS:  Please see the history of present illness.   Otherwise, review of systems are positive for rare palpitations.   All other systems are reviewed and negative.    PHYSICAL EXAM: VS:  BP 110/70   Pulse 67   Ht 5\' 7"  (1.702 m)   Wt 166 lb 9.6 oz (75.6 kg)   LMP 05/15/2005 (Approximate)   SpO2 96%   BMI 26.09 kg/m  , BMI Body mass index is 26.09 kg/m. GEN: Well nourished, well developed, in no acute distress  HEENT: normal  Neck: no JVD, carotid bruits, or masses Cardiac: RRR; no murmurs, rubs, or gallops,no edema  Respiratory:  clear to auscultation bilaterally, normal work of breathing GI: soft, nontender, nondistended, + BS MS: no deformity or atrophy  Skin: warm and dry, no rash Neuro:  Strength and sensation are intact Psych: euthymic mood, full affect   EKG:   The ekg ordered Jan 2021 demonstrates NSR, no ST changes   Recent Labs: 03/20/2019: ALT 20; BUN 17; Creatinine, Ser 0.86; Potassium 4.4; Sodium 140   Lipid Panel    Component Value  Date/Time   CHOL 250 (H) 03/20/2019 1128   TRIG 69 03/20/2019 1128   TRIG 57 10/03/2016 0810   HDL 73 03/20/2019 1128   HDL 79 10/03/2016 0810   CHOLHDL 3.4 03/20/2019 1128   CHOLHDL 2.6 12/09/2015 0740   VLDL 10 12/09/2015 0740   LDLCALC 166 (H) 03/20/2019 1128   LDLDIRECT 154.1 02/28/2013 0809     Other studies Reviewed: Additional studies/ records that were reviewed today with results demonstrating: TC 250 in 03/2019, HDL 73 in 03/2019.   ASSESSMENT AND PLAN:  1. PACs, PVCs: Taking metoprolol twice a day and has the flexibility to take an extra dose if needed.  If she did not respond to metoprolol, could consider EP referral.  Sx stable at  this time. 2. Hyperlipidemia: We have discussed calcium scoring CT to see whether statin will be needed.  Healthy diet and exercise.   3. MVP: Noted on prior echocardiogram in 2016.  Described as trivial mitral valve prolapse by Virginia Tate.  Will hold of on echo now since she is asymptomatic.   Current medicines are reviewed at length with the patient today.  The patient concerns regarding her medicines were addressed.  The following changes have been made:  No change  Labs/ tests ordered today include:  No orders of the defined types were placed in this encounter.   Recommend 150 minutes/week of aerobic exercise Low fat, low carb, high fiber diet recommended  Disposition:   FU in 1 year   Signed, Larae Grooms, Tate  11/03/2019 3:47 PM    [facility] [address], [place], [place]  [address] Phone: [phone]; Fax: [phone]

## 2019-11-03 ENCOUNTER — Other Ambulatory Visit: Payer: Self-pay

## 2019-11-03 ENCOUNTER — Ambulatory Visit (INDEPENDENT_AMBULATORY_CARE_PROVIDER_SITE_OTHER): Payer: BC Managed Care – PPO | Admitting: Interventional Cardiology

## 2019-11-03 ENCOUNTER — Encounter: Payer: Self-pay | Admitting: Interventional Cardiology

## 2019-11-03 VITALS — BP 110/70 | HR 67 | Ht 67.0 in | Wt 166.6 lb

## 2019-11-03 DIAGNOSIS — I341 Nonrheumatic mitral (valve) prolapse: Secondary | ICD-10-CM

## 2019-11-03 DIAGNOSIS — I491 Atrial premature depolarization: Secondary | ICD-10-CM | POA: Diagnosis not present

## 2019-11-03 DIAGNOSIS — I493 Ventricular premature depolarization: Secondary | ICD-10-CM | POA: Diagnosis not present

## 2019-11-03 DIAGNOSIS — E782 Mixed hyperlipidemia: Secondary | ICD-10-CM

## 2019-11-03 MED ORDER — METOPROLOL SUCCINATE ER 25 MG PO TB24: 25.0000 mg | ORAL_TABLET | Freq: Two times a day (BID) | ORAL | 3 refills | Status: DC

## 2019-11-03 NOTE — Patient Instructions (Signed)
Medication Instructions:  Your physician recommends that you continue on your current medications as directed. Please refer to the Current Medication list given to you today.  *If you need a refill on your cardiac medications before your next appointment, please call your pharmacy*   Lab Work: None  If you have labs (blood work) drawn today and your tests are completely normal, you will receive your results only by: . MyChart Message (if you have MyChart) OR . A paper copy in the mail If you have any lab test that is abnormal or we need to change your treatment, we will call you to review the results.   Testing/Procedures: None   Follow-Up: At [facility], you and your health needs are our priority.  As part of our continuing mission to provide you with exceptional heart care, we have created designated Provider Care Teams.  These Care Teams include your primary Cardiologist (physician) and Advanced Practice Providers (APPs -  Physician Assistants and Nurse Practitioners) who all work together to provide you with the care you need, when you need it.  We recommend signing up for the patient portal called "MyChart".  Sign up information is provided on this After Visit Summary.  MyChart is used to connect with patients for Virtual Visits (Telemedicine).  Patients are able to view lab/test results, encounter notes, upcoming appointments, etc.  Non-urgent messages can be sent to your provider as well.   To learn more about what you can do with MyChart, go to [email or web address].    Your next appointment:   12 month(s)  The format for your next appointment:   In Person  Provider:   You may see Jay Varanasi, MD or one of the following Advanced Practice Providers on your designated Care Team:    Dayna Dunn, PA-C  Michele Lenze, PA-C    Other Instructions None  

## 2019-12-03 ENCOUNTER — Ambulatory Visit: Payer: BC Managed Care – PPO

## 2019-12-22 ENCOUNTER — Other Ambulatory Visit: Payer: Self-pay

## 2019-12-22 DIAGNOSIS — N952 Postmenopausal atrophic vaginitis: Secondary | ICD-10-CM

## 2019-12-22 DIAGNOSIS — Z780 Asymptomatic menopausal state: Secondary | ICD-10-CM

## 2019-12-22 MED ORDER — METOPROLOL SUCCINATE ER 25 MG PO TB24: 25.0000 mg | ORAL_TABLET | Freq: Two times a day (BID) | ORAL | 3 refills | Status: DC

## 2019-12-22 NOTE — Telephone Encounter (Signed)
Medication refill request: Estring 2mg  Last AEX:  03/24/19 Next AEX: 03/24/20 Last MMG (if hormonal medication request): 02/13/19  Normal  Refill authorized: 1/3

## 2019-12-23 MED ORDER — ESTRING 2 MG VA RING: 2.0000 mg | VAGINAL_RING | VAGINAL | 0 refills | Status: DC

## 2020-01-13 ENCOUNTER — Other Ambulatory Visit: Payer: Self-pay | Admitting: Family Medicine

## 2020-01-13 DIAGNOSIS — Z1231 Encounter for screening mammogram for malignant neoplasm of breast: Secondary | ICD-10-CM

## 2020-02-26 ENCOUNTER — Other Ambulatory Visit: Payer: Self-pay

## 2020-02-26 ENCOUNTER — Ambulatory Visit
Admission: RE | Admit: 2020-02-26 | Discharge: 2020-02-26 | Disposition: A | Discharge status: Home / Self Care | Payer: BC Managed Care – PPO | Source: Ambulatory Visit | Attending: Family Medicine | Admitting: Family Medicine

## 2020-02-26 DIAGNOSIS — Z1231 Encounter for screening mammogram for malignant neoplasm of breast: Secondary | ICD-10-CM

## 2020-03-10 ENCOUNTER — Other Ambulatory Visit: Payer: Self-pay | Admitting: *Deleted

## 2020-03-10 DIAGNOSIS — Z780 Asymptomatic menopausal state: Secondary | ICD-10-CM

## 2020-03-10 MED ORDER — ESTRING 2 MG VA RING: 2.0000 mg | VAGINAL_RING | VAGINAL | 0 refills | Status: DC

## 2020-03-10 NOTE — Telephone Encounter (Signed)
Medication refill request: Estring  Last AEX: 03-24-2019 BS Next AEX: 03-24-20  Last MMG (if hormonal medication request): 02-26-20 density B/BIRADS 1 negative  Refill authorized: Today, please advise.   Medication pended for #1, 0RF. Please refill if appropriate.

## 2020-03-23 NOTE — Progress Notes (Signed)
64 y.o. G6P2003 Married Caucasian female here for annual exam.    Patient has irritated area at top of labia for 3 - 4 days. Itching.  No pain.  Hx eczema. Some exercise on a bike.  No hx fever blisters.  Son married in [place] recently.  Husband retired.   Covid booster done.  Flu vaccine done.   PCP:  Kelton Pillar, MD   Patient's last menstrual period was 05/15/2005 (approximate).           Sexually active: Yes.    The current method of family planning is post menopausal status.    Exercising: Yes.    pilates 2x/week, trainer 2x/week, spin class Smoker:  no  Health Maintenance: Pap:  09-30-18 Neg:Neg HR HPV, 03-20-18 ASCUS:Neg HR HPV, 03-12-17 Neg:Neg HR HPV History of abnormal Pap:  Yes,  Hx of LEEP with Dr. Cherylann Banas ~2009, had pap q 3 months for a year all paps were normal MMG: 02-26-20 3D/Implants/Neg/density B/BiRads1 Colonoscopy:  02-24-10 normal;next 2021 -- has scheduled 05-03-20. BMD: 04-22-18  Result :Normal TDaP:  2012 Gardasil:   no HIV: done with PCP Hep C: done with PCP Screening Labs:  PCP.   reports that she has never smoked. She has never used smokeless tobacco. She reports current alcohol use of about 2.0 standard drinks of alcohol per week. She reports that she does not use drugs.  Past Medical History:  Diagnosis Date  . Bartholin's gland abscess   . Heart murmur   . Hyperlipidemia    has favorable LDL particle size and low insulin resistance score.  Marland Kitchen LGSIL (low grade squamous intraepithelial dysplasia)   . MVP (mitral valve prolapse)    No antibiotics required  . Palpitations   . PVC's (premature ventricular contractions)   . Shingles 10-22-15   torso    Past Surgical History:  Procedure Laterality Date  . APPENDECTOMY    . AUGMENTATION MAMMAPLASTY Bilateral   . BLEPHAROPLASTY Bilateral 10/2015   ---[facility]  . CERVICAL BIOPSY  W/ LOOP ELECTRODE EXCISION  2006?   Had pap q 3 months all normal   . CESAREAN SECTION    . COLPOSCOPY    .  KNEE ARTHROSCOPY Right 06/17/2019   Procedure: RIGHT KNEE ARTHROSCOPY WITH PARTIAL MEDIAL MENISECTOMY;  Surgeon: Melrose Nakayama, MD;  Location: [facility];  Service: Orthopedics;  Laterality: Right;  . TONSILLECTOMY AND ADENOIDECTOMY    . US ECHOCARDIOGRAPHY  09/03/2006   EF 55-60%    Current Outpatient Medications  Medication Sig Dispense Refill  . Calcium Carbonate-Vitamin D (CALCIUM 600 + D PO) Take 650 mg by mouth daily. ADORA    . cholecalciferol (VITAMIN D) 1000 UNITS tablet Take 1,000 Units by mouth daily.      . Coenzyme Q10 (COQ10 PO) Take 200 mg by mouth daily.     . Cyanocobalamin (VITAMIN B 12 PO) Take 1,000 mcg by mouth daily.    Marland Kitchen estradiol (ESTRING) 2 MG vaginal ring Place 2 mg vaginally every 3 (three) months. follow package directions 1 each 0  . fluoruracil (CARAC) 0.5 % cream Apply 1 application topically daily.    . Magnesium (V-R MAGNESIUM) 250 MG TABS Take 250 mg by mouth daily.    . metoprolol succinate (TOPROL-XL) 25 MG 24 hr tablet Take 1 tablet (25 mg total) by mouth 2 (two) times daily. 180 tablet 3  . Omega-3 Fatty Acids (OMEGA-3 FISH OIL) 1200 MG CAPS Take 1 capsule by mouth daily.    . Probiotic Product (  SOLUBLE FIBER/PROBIOTICS PO) Take 1 capsule by mouth daily.     Marland Kitchen triamcinolone cream (KENALOG) 0.1 % Apply topically.     No current facility-administered medications for this visit.    Family History  Problem Relation Age of Onset  . Heart failure Father   . Hypertension Father   . Hypertension Mother   . Dementia Mother   . Diabetes Sister   . Breast cancer Sister        Age 65  . Hypertension Brother   . Breast cancer Paternal Grandmother        Age 26's  . Heart attack Maternal Grandfather     Review of Systems  All other systems reviewed and are negative.   Exam:   BP 110/70   Pulse (!) 56   Ht 5' 5.5" (1.664 m)   Wt 166 lb (75.3 kg)   LMP 05/15/2005 (Approximate)   SpO2 92%   BMI 27.20 kg/m     General appearance:  alert, cooperative and appears stated age Head: normocephalic, without obvious abnormality, atraumatic Neck: no adenopathy, supple, symmetrical, trachea midline and thyroid normal to inspection and palpation Lungs: clear to auscultation bilaterally Breasts: bilateral implants, no masses or tenderness, No nipple retraction or dimpling, No nipple discharge or bleeding, No axillary adenopathy Heart: regular rate and rhythm Abdomen: soft, non-tender; no masses, no organomegaly Extremities: extremities normal, atraumatic, no cyanosis or edema Skin: skin color, texture, turgor normal. No rashes or lesions Lymph nodes: cervical, supraclavicular, and axillary nodes normal. Neurologic: grossly normal  Pelvic: External genitalia:  Right periclitoral ulceration with surrounding erythema.              No abnormal inguinal nodes palpated.              Urethra:  normal appearing urethra with no masses, tenderness or lesions              Bartholins and Skenes: normal                 Vagina: posterior vagina with mild erythema from contact with Estring.               Cervix: no lesions              Pap taken: No. Bimanual Exam:  Uterus:  normal size, contour, position, consistency, mobility, non-tender              Adnexa: no mass, fullness, tenderness  Estring removed for exam.                Rectal exam: Yes.  .  Confirms.              Anus:  normal sphincter tone, no lesions  Chaperone was present for exam.  Assessment:   Well woman visit with normal exam. Bilateral implants.  Hx LEEP.  Hx positive HR HPV in 2015, with negative follow up Hr HPV testing.   Hx mild GSI. Vaginal atrophy. Vulvitis.  Local irritation from contact with clothing/bike.   Plan: Mammogram screening discussed. Self breast awareness reviewed. Pap next year.  Guidelines for Calcium, Vitamin D, regular exercise program including cardiovascular and weight bearing exercise. If lesion persists, return for vulvar  biopsy.  [place] for benadryl cream, hydrocortisone cream, abx ointment.  Refill of Estring.   Follow up annually and prn.

## 2020-03-24 ENCOUNTER — Encounter: Payer: Self-pay | Admitting: Obstetrics and Gynecology

## 2020-03-24 ENCOUNTER — Other Ambulatory Visit: Payer: Self-pay

## 2020-03-24 ENCOUNTER — Ambulatory Visit (INDEPENDENT_AMBULATORY_CARE_PROVIDER_SITE_OTHER): Payer: Medicare Other | Admitting: Obstetrics and Gynecology

## 2020-03-24 DIAGNOSIS — Z780 Asymptomatic menopausal state: Secondary | ICD-10-CM

## 2020-03-24 DIAGNOSIS — Z01419 Encounter for gynecological examination (general) (routine) without abnormal findings: Secondary | ICD-10-CM

## 2020-03-24 MED ORDER — ESTRING 2 MG VA RING: 2.0000 mg | VAGINAL_RING | VAGINAL | 2 refills | Status: DC

## 2020-03-24 NOTE — Patient Instructions (Signed)

## 2020-05-17 ENCOUNTER — Telehealth: Payer: Self-pay

## 2020-05-17 DIAGNOSIS — E782 Mixed hyperlipidemia: Secondary | ICD-10-CM

## 2020-05-17 NOTE — Telephone Encounter (Signed)
-----   Message from Corky Crafts, MD sent at 05/17/2020  8:45 AM EST ----- Regarding: calcium scoring CT Please set up a calcium scoring CT for this patient with hyperlipidemia.  She is aware of the $100 out of pocket cost and is agreeable.   JV

## 2020-05-17 NOTE — Telephone Encounter (Signed)
Ordered test. Waiver printed patient is aware of cost, but not present to sign.

## 2020-05-24 ENCOUNTER — Other Ambulatory Visit: Payer: Self-pay

## 2020-05-24 ENCOUNTER — Ambulatory Visit (INDEPENDENT_AMBULATORY_CARE_PROVIDER_SITE_OTHER)
Admission: RE | Admit: 2020-05-24 | Discharge: 2020-05-24 | Disposition: A | Discharge status: Home / Self Care | Payer: Self-pay | Source: Ambulatory Visit | Attending: Interventional Cardiology | Admitting: Interventional Cardiology

## 2020-05-24 DIAGNOSIS — E782 Mixed hyperlipidemia: Secondary | ICD-10-CM

## 2020-10-13 DIAGNOSIS — U071 COVID-19: Secondary | ICD-10-CM

## 2020-10-13 DIAGNOSIS — Z8616 Personal history of COVID-19: Secondary | ICD-10-CM

## 2020-10-13 HISTORY — DX: Personal history of COVID-19: Z86.16

## 2020-10-13 HISTORY — DX: COVID-19: U07.1

## 2020-11-16 NOTE — Progress Notes (Signed)
Cardiology Office Note   Date:  11/17/2020   ID:  Virginia Tate, DOB May 26, 1955, MRN [id number]  PCP:  Kelton Pillar, MD    No chief complaint on file.  Hyperlipidemia, PACs and PVCs  Wt Readings from Last 3 Encounters:  11/17/20 173 lb (78.5 kg)  03/24/20 166 lb (75.3 kg)  11/03/19 166 lb 9.6 oz (75.6 kg)       History of Present Illness: Virginia Tate is a 65 y.o. female   who  has a past history of mild mitral valve prolapse. She was seen by Dr. Mare Ferrari for many years. An echocardiogram in 2008 which showed a small degree of mitral valve prolapse and trace mitral regurgitation. She has a past history of PVCs. She has a past history of hyperlipidemia but because of favorable LDL particle size and low insulin resistance score she has not had to go on any statin therapy. SHe has a high HDL as well.   Because of her increased palpitations she underwent an 2-D echocardiogram on 07/29/14 which showed normal left ventricular systolic function with ejection fraction 60-65%.  There was trivial mitral regurgitation.. 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   She had a 48-hour Holter monitor in 2017 which did not show any significant arrhythmias.  She was in normal sinus rhythm.  There was no atrial fibrillation.  She had scattered premature atrial beats and premature ventricular beats.  Some of the premature beats were interpolated.  The patient previously had been on nadolol which lost efficacy.  We switched her to metoprolol succinate.  She initially was on 25 mg daily and we have increased it to 50 mg daily.    On this regimen she did fairly well with just occasional palpitations which did not bother her much. Instead of taking 50 mg every morning, she was switched to 25 mg twice a day.   Lipids were elevated in 5/17 at PMD and we decided to consider once a week statin.  She had a 2016 renal stone CT and heart was partially visualized by her husband and there  was no evidence of coronary calcium.  Therefore, we have not treated with a statin.  We discussed a coronary CT calcium scoring scan.     She is neighbors with Dr. Ellouise Newer. She had knee surgery from torn cartilage in 2/21.     2022 calcium score CT showed: "Ascending Aorta: Normal caliber   Pericardium: Normal   Coronary arteries: Normal origins   IMPRESSION: Coronary calcium score of 0. This is a low risk study."  Denies : Chest pain. Dizziness. Leg edema. Nitroglycerin use. Orthopnea. Palpitations. Paroxysmal nocturnal dyspnea. Syncope.    She had COVID in 4/22.  Some fatigue but this resolved.   Reports some new SHOB with walking up stairs.     Past Medical History:  Diagnosis Date   Bartholin's gland abscess    Heart murmur    Hyperlipidemia    has favorable LDL particle size and low insulin resistance score.   LGSIL (low grade squamous intraepithelial dysplasia)    MVP (mitral valve prolapse)    No antibiotics required   Palpitations    PVC's (premature ventricular contractions)    Shingles 10-22-15   torso    Past Surgical History:  Procedure Laterality Date   APPENDECTOMY     AUGMENTATION MAMMAPLASTY Bilateral    BLEPHAROPLASTY Bilateral 10/2015   ---[facility]   CERVICAL BIOPSY  W/ LOOP ELECTRODE EXCISION  2006?   Had pap q 3 months all normal    CESAREAN SECTION     COLPOSCOPY     KNEE ARTHROSCOPY Right 06/17/2019   Procedure: RIGHT KNEE ARTHROSCOPY WITH PARTIAL MEDIAL MENISECTOMY;  Surgeon: Melrose Nakayama, MD;  Location: [facility];  Service: Orthopedics;  Laterality: Right;   TONSILLECTOMY AND ADENOIDECTOMY     US ECHOCARDIOGRAPHY  09/03/2006   EF 55-60%     Current Outpatient Medications  Medication Sig Dispense Refill   Calcium Carbonate-Vitamin D (CALCIUM 600 + D PO) Take 650 mg by mouth daily. ADORA     cholecalciferol (VITAMIN D) 1000 UNITS tablet Take 1,000 Units by mouth daily.       Coenzyme Q10 (COQ10 PO) Take 200 mg by mouth  daily.      Cyanocobalamin (VITAMIN B 12 PO) Take 1,000 mcg by mouth daily.     Magnesium 250 MG TABS Take 250 mg by mouth daily.     metoprolol succinate (TOPROL-XL) 25 MG 24 hr tablet Take 1 tablet (25 mg total) by mouth 2 (two) times daily. 180 tablet 3   Omega-3 Fatty Acids (OMEGA-3 FISH OIL) 1200 MG CAPS Take 1 capsule by mouth daily.     Probiotic Product (SOLUBLE FIBER/PROBIOTICS PO) Take 1 capsule by mouth daily.      estradiol (ESTRING) 2 MG vaginal ring Place 2 mg vaginally every 3 (three) months. follow package directions (Patient not taking: Reported on 11/17/2020) 1 each 2   fluoruracil (CARAC) 0.5 % cream Apply 1 application topically daily. (Patient not taking: Reported on 11/17/2020)     triamcinolone cream (KENALOG) 0.1 % Apply topically. (Patient not taking: Reported on 11/17/2020)     No current facility-administered medications for this visit.    Allergies:   Patient has no known allergies.    Social History:  The patient  reports that she has never smoked. She has never used smokeless tobacco. She reports current alcohol use of about 2.0 standard drinks of alcohol per week. She reports that she does not use drugs.   Family History:  The patient's family history includes Breast cancer in her paternal grandmother and sister; Dementia in her mother; Diabetes in her sister; Heart attack in her maternal grandfather; Heart failure in her father; Hypertension in her brother, father, and mother.    ROS:  Please see the history of present illness.   Otherwise, review of systems are positive for mild shortness of breath with walking upstairs-this is new.   All other systems are reviewed and negative.    PHYSICAL EXAM: VS:  BP (!) 122/58   Pulse 79   Ht 5' 5.5" (1.664 m)   Wt 173 lb (78.5 kg)   LMP 05/15/2005 (Approximate)   SpO2 95%   BMI 28.35 kg/m  , BMI Body mass index is 28.35 kg/m. GEN: Well nourished, well developed, in no acute distress HEENT: normal Neck: no JVD,  carotid bruits, or masses Cardiac: RRR; no murmurs, rubs, or gallops,no edema  Respiratory:  clear to auscultation bilaterally, normal work of breathing GI: soft, nontender, nondistended, + BS MS: no deformity or atrophy Skin: warm and dry, no rash Neuro:  Strength and sensation are intact Psych: euthymic mood, full affect   EKG:   The ekg ordered today demonstrates NSR, PACs, nonspecific ST changes   Recent Labs: No results found for requested labs within last 8760 hours.   Lipid Panel    Component Value Date/Time   CHOL 250 (H) 03/20/2019  1128   TRIG 69 03/20/2019 1128   TRIG 57 10/03/2016 0810   HDL 73 03/20/2019 1128   HDL 79 10/03/2016 0810   CHOLHDL 3.4 03/20/2019 1128   CHOLHDL 2.6 12/09/2015 0740   VLDL 10 12/09/2015 0740   LDLCALC 166 (H) 03/20/2019 1128   LDLDIRECT 154.1 02/28/2013 0809     Other studies Reviewed: Additional studies/ records that were reviewed today with results demonstrating: calcium scoring CT..   ASSESSMENT AND PLAN:  PACs/PVCs: Sx controlled.  Had a few PACs on today's ECG which she did feel.  She can go weeks between feeling symptoms.  Continue metoprolol. Hyperlipidemia: High HDL.  LDL 173 in 2021.  Calcium score of 0.  We will hold off on statin therapy at this time. MVP: trivial by 2016 echo.  Given shortness of breath, will recheck echocardiogram.   Current medicines are reviewed at length with the patient today.  The patient concerns regarding her medicines were addressed.  The following changes have been made:  No change  Labs/ tests ordered today include:  No orders of the defined types were placed in this encounter.   Recommend 150 minutes/week of aerobic exercise Low fat, low carb, high fiber diet recommended  Disposition:   FU in 1 year   Signed, Larae Grooms, MD  11/17/2020 10:55 AM    [facility] [address], [place], [place]  [address] Phone: [phone]; Fax: [phone]

## 2020-11-17 ENCOUNTER — Other Ambulatory Visit: Payer: Self-pay

## 2020-11-17 ENCOUNTER — Ambulatory Visit (INDEPENDENT_AMBULATORY_CARE_PROVIDER_SITE_OTHER): Payer: Medicare Other | Admitting: Interventional Cardiology

## 2020-11-17 ENCOUNTER — Encounter: Payer: Self-pay | Admitting: Interventional Cardiology

## 2020-11-17 VITALS — BP 122/58 | HR 79 | Ht 65.5 in | Wt 173.0 lb

## 2020-11-17 DIAGNOSIS — I491 Atrial premature depolarization: Secondary | ICD-10-CM | POA: Diagnosis not present

## 2020-11-17 DIAGNOSIS — I341 Nonrheumatic mitral (valve) prolapse: Secondary | ICD-10-CM

## 2020-11-17 DIAGNOSIS — I493 Ventricular premature depolarization: Secondary | ICD-10-CM | POA: Diagnosis not present

## 2020-11-17 DIAGNOSIS — E782 Mixed hyperlipidemia: Secondary | ICD-10-CM

## 2020-11-17 NOTE — Patient Instructions (Signed)
Medication Instructions:  Your physician recommends that you continue on your current medications as directed. Please refer to the Current Medication list given to you today.  *If you need a refill on your cardiac medications before your next appointment, please call your pharmacy*   Lab Work: none If you have labs (blood work) drawn today and your tests are completely normal, you will receive your results only by: [place] (if you have MyChart) OR A paper copy in the mail If you have any lab test that is abnormal or we need to change your treatment, we will call you to review the results.   Testing/Procedures: Your physician has requested that you have an echocardiogram. Echocardiography is a painless test that uses sound waves to create images of your heart. It provides your doctor with information about the size and shape of your heart and how well your heart's chambers and valves are working. This procedure takes approximately one hour. There are no restrictions for this procedure.    Follow-Up: At [facility], you and your health needs are our priority.  As part of our continuing mission to provide you with exceptional heart care, we have created designated Provider Care Teams.  These Care Teams include your primary Cardiologist (physician) and Advanced Practice Providers (APPs -  Physician Assistants and Nurse Practitioners) who all work together to provide you with the care you need, when you need it.  We recommend signing up for the patient portal called "MyChart".  Sign up information is provided on this After Visit Summary.  MyChart is used to connect with patients for Virtual Visits (Telemedicine).  Patients are able to view lab/test results, encounter notes, upcoming appointments, etc.  Non-urgent messages can be sent to your provider as well.   To learn more about what you can do with MyChart, go to [email or web address].    Your next appointment:   12  month(s)  The format for your next appointment:   In Person  Provider:   You may see Larae Grooms, MD  or one of the following Advanced Practice Providers on your designated Care Team:   Melina Copa, PA-C Ermalinda Barrios, PA-C   Other Instructions

## 2020-12-13 ENCOUNTER — Other Ambulatory Visit: Payer: Self-pay

## 2020-12-13 ENCOUNTER — Ambulatory Visit (HOSPITAL_COMMUNITY): Payer: Medicare Other | Attending: Internal Medicine

## 2020-12-13 DIAGNOSIS — I341 Nonrheumatic mitral (valve) prolapse: Secondary | ICD-10-CM | POA: Diagnosis present

## 2020-12-13 LAB — ECHOCARDIOGRAM COMPLETE
Area-P 1/2: 2.53 cm2
S' Lateral: 2.7 cm

## 2021-01-09 ENCOUNTER — Other Ambulatory Visit: Payer: Self-pay | Admitting: Interventional Cardiology

## 2021-03-01 ENCOUNTER — Other Ambulatory Visit: Payer: Self-pay | Admitting: Family Medicine

## 2021-03-01 ENCOUNTER — Other Ambulatory Visit (HOSPITAL_COMMUNITY): Payer: Self-pay | Admitting: Family Medicine

## 2021-03-01 DIAGNOSIS — R1033 Periumbilical pain: Secondary | ICD-10-CM

## 2021-03-02 ENCOUNTER — Ambulatory Visit (HOSPITAL_BASED_OUTPATIENT_CLINIC_OR_DEPARTMENT_OTHER)
Admission: RE | Admit: 2021-03-02 | Discharge: 2021-03-02 | Disposition: A | Discharge status: Home / Self Care | Payer: Medicare Other | Source: Ambulatory Visit | Attending: Family Medicine | Admitting: Family Medicine

## 2021-03-02 ENCOUNTER — Other Ambulatory Visit: Payer: Self-pay

## 2021-03-02 DIAGNOSIS — R1033 Periumbilical pain: Secondary | ICD-10-CM | POA: Diagnosis present

## 2021-03-30 ENCOUNTER — Other Ambulatory Visit: Payer: Self-pay | Admitting: Family Medicine

## 2021-03-30 ENCOUNTER — Ambulatory Visit (INDEPENDENT_AMBULATORY_CARE_PROVIDER_SITE_OTHER): Payer: Medicare Other | Admitting: Obstetrics and Gynecology

## 2021-03-30 ENCOUNTER — Other Ambulatory Visit (HOSPITAL_COMMUNITY)
Admission: RE | Admit: 2021-03-30 | Discharge: 2021-03-30 | Disposition: A | Discharge status: Home / Self Care | Payer: Medicare Other | Source: Ambulatory Visit | Attending: Obstetrics and Gynecology | Admitting: Obstetrics and Gynecology

## 2021-03-30 ENCOUNTER — Other Ambulatory Visit: Payer: Self-pay

## 2021-03-30 ENCOUNTER — Encounter: Payer: Self-pay | Admitting: Obstetrics and Gynecology

## 2021-03-30 VITALS — BP 122/70 | HR 69 | Ht 65.5 in | Wt 167.0 lb

## 2021-03-30 DIAGNOSIS — Z1231 Encounter for screening mammogram for malignant neoplasm of breast: Secondary | ICD-10-CM

## 2021-03-30 DIAGNOSIS — Z01419 Encounter for gynecological examination (general) (routine) without abnormal findings: Secondary | ICD-10-CM

## 2021-03-30 DIAGNOSIS — Z124 Encounter for screening for malignant neoplasm of cervix: Secondary | ICD-10-CM | POA: Insufficient documentation

## 2021-03-30 NOTE — Progress Notes (Signed)
65 y.o. G23P2003 Married Caucasian female here for annual exam.    Just moved into a new house.  Stopped Estring.   PCP:  Kelton Pillar, MD  Patient's last menstrual period was 05/15/2005 (approximate).           Sexually active: Yes.    The current method of family planning is post menopausal status.    Exercising: Yes.     Pilates and personal trainer Smoker:  no  Health Maintenance: Pap:   09-30-18 Neg:Neg HR HPV, 03-20-18 ASCUS:Neg HR HPV, 03-12-17 Neg:Neg HR HPV History of abnormal Pap:  yes, Hx of LEEP with Dr. Cherylann Banas ~2009, had pap q 3 months for a year all paps were normal  MMG:  02-26-20 3D w/Implants/Neg/BiRads1.  She will schedule.  Colonoscopy:  2021 normal;next 10 years BMD: 04-22-18 Result :Normal TDaP:  PCP Gardasil:   no ZOX:WRUEAVW blood in past Hep C: done with PCP Screening Labs:  PCP.  Did flu vaccine and new Covid booster.    reports that she has never smoked. She has never used smokeless tobacco. She reports current alcohol use of about 4.0 standard drinks per week. She reports that she does not use drugs.  Past Medical History:  Diagnosis Date   Bartholin's gland abscess    Heart murmur    Hyperlipidemia    has favorable LDL particle size and low insulin resistance score.   LGSIL (low grade squamous intraepithelial dysplasia)    MVP (mitral valve prolapse)    No antibiotics required   Palpitations    PVC's (premature ventricular contractions)    Shingles 10-22-15   torso    Past Surgical History:  Procedure Laterality Date   APPENDECTOMY     AUGMENTATION MAMMAPLASTY Bilateral    BLEPHAROPLASTY Bilateral 10/2015   ---[facility]   CERVICAL BIOPSY  W/ LOOP ELECTRODE EXCISION  2006?   Had pap q 3 months all normal    CESAREAN SECTION     COLPOSCOPY     KNEE ARTHROSCOPY Right 06/17/2019   Procedure: RIGHT KNEE ARTHROSCOPY WITH PARTIAL MEDIAL MENISECTOMY;  Surgeon: Melrose Nakayama, MD;  Location: [facility];  Service: Orthopedics;   Laterality: Right;   TONSILLECTOMY AND ADENOIDECTOMY     US ECHOCARDIOGRAPHY  09/03/2006   EF 55-60%    Current Outpatient Medications  Medication Sig Dispense Refill   Calcium Carbonate-Vitamin D (CALCIUM 600 + D PO) Take 650 mg by mouth daily. ADORA     cholecalciferol (VITAMIN D) 1000 UNITS tablet Take 1,000 Units by mouth daily.       Coenzyme Q10 (COQ10 PO) Take 200 mg by mouth daily.      Cyanocobalamin (VITAMIN B 12 PO) Take 1,000 mcg by mouth daily.     fluoruracil (CARAC) 0.5 % cream Apply 1 application topically daily.     Magnesium 250 MG TABS Take 250 mg by mouth daily.     metoprolol succinate (TOPROL-XL) 25 MG 24 hr tablet 1 tablet     Omega-3 Fatty Acids (OMEGA-3 FISH OIL) 1200 MG CAPS Take 1 capsule by mouth daily.     Probiotic Product (SOLUBLE FIBER/PROBIOTICS PO) Take 1 capsule by mouth daily.      triamcinolone cream (KENALOG) 0.1 % Apply topically.     No current facility-administered medications for this visit.    Family History  Problem Relation Age of Onset   Heart failure Father    Hypertension Father    Hypertension Mother    Dementia Mother  Diabetes Sister    Breast cancer Sister        Age 70   Hypertension Brother    Breast cancer Paternal Grandmother        Age 27's   Heart attack Maternal Grandfather     Review of Systems  All other systems reviewed and are negative.  Exam:   BP 122/70   Pulse 69   Ht 5' 5.5" (1.664 m)   Wt 167 lb (75.8 kg)   LMP 05/15/2005 (Approximate)   SpO2 99%   BMI 27.37 kg/m     General appearance: alert, cooperative and appears stated age Head: normocephalic, without obvious abnormality, atraumatic Neck: no adenopathy, supple, symmetrical, trachea midline and thyroid normal to inspection and palpation Lungs: clear to auscultation bilaterally Breasts: normal appearance, bilateral implants, no masses or tenderness, No nipple retraction or dimpling, No nipple discharge or bleeding, No axillary  adenopathy Heart: regular rate and rhythm Abdomen: soft, non-tender; no masses, no organomegaly Extremities: extremities normal, atraumatic, no cyanosis or edema Skin: skin color, texture, turgor normal. No rashes or lesions Lymph nodes: cervical, supraclavicular, and axillary nodes normal. Neurologic: grossly normal  Pelvic: External genitalia:  no lesions              No abnormal inguinal nodes palpated.              Urethra:  normal appearing urethra with no masses, tenderness or lesions              Bartholins and Skenes: normal                 Vagina: normal appearing vagina with normal color and discharge, no lesions              Cervix: no lesions              Pap taken: yes Bimanual Exam:  Uterus:  normal size, contour, position, consistency, mobility, non-tender              Adnexa: no mass, fullness, tenderness              Rectal exam: yes.  Confirms.              Anus:  normal sphincter tone, no lesions  Chaperone was present for exam:  Estill Bamberg, CMA  Assessment:   Well woman visit with gynecologic exam. Bilateral implants.  Hx LEEP.  Hx positive HR HPV in 2015, with negative follow up Hr HPV testing.   Cervical cancer screening.  Vaginal atrophy.   Plan: Mammogram screening discussed. Self breast awareness reviewed. Pap and HR HPV as above. Guidelines for Calcium, Vitamin D, regular exercise program including cardiovascular and weight bearing exercise. We discussed vaginal vitamin E for vaginal atrophy.   Follow up in 2 years and prn.   After visit summary provided.

## 2021-03-30 NOTE — Patient Instructions (Signed)

## 2021-04-01 LAB — CYTOLOGY - PAP: Diagnosis: NEGATIVE

## 2021-05-17 ENCOUNTER — Other Ambulatory Visit: Payer: Self-pay

## 2021-05-17 ENCOUNTER — Ambulatory Visit
Admission: RE | Admit: 2021-05-17 | Discharge: 2021-05-17 | Disposition: A | Discharge status: Home / Self Care | Payer: Medicare Other | Source: Ambulatory Visit | Attending: Family Medicine | Admitting: Family Medicine

## 2021-05-17 DIAGNOSIS — Z1231 Encounter for screening mammogram for malignant neoplasm of breast: Secondary | ICD-10-CM

## 2021-08-09 ENCOUNTER — Encounter: Payer: Self-pay | Admitting: Interventional Cardiology

## 2021-10-11 ENCOUNTER — Encounter: Payer: Self-pay | Admitting: Interventional Cardiology

## 2021-10-11 MED ORDER — METOPROLOL SUCCINATE ER 25 MG PO TB24: 25.0000 mg | ORAL_TABLET | Freq: Two times a day (BID) | ORAL | 0 refills | Status: DC

## 2022-01-27 ENCOUNTER — Ambulatory Visit: Payer: Medicare Other | Admitting: Interventional Cardiology

## 2022-01-31 ENCOUNTER — Other Ambulatory Visit: Payer: Self-pay | Admitting: Interventional Cardiology

## 2022-03-20 NOTE — Progress Notes (Unsigned)
Cardiology Office Note   Date:  03/21/2022   ID:  Virginia Tate, DOB Aug 15, 1955, MRN [id number]  PCP:  Kelton Pillar, MD    No chief complaint on file.  PAC, PVC, Hyperlipidemia  Wt Readings from Last 3 Encounters:  03/21/22 173 lb (78.5 kg)  03/30/21 167 lb (75.8 kg)  11/17/20 173 lb (78.5 kg)       History of Present Illness: Virginia Tate is a 66 y.o. female  who  has a past history of mild mitral valve prolapse. She was seen by Dr. Mare Ferrari for many years. An echocardiogram in 2008 which showed a small degree of mitral valve prolapse and trace mitral regurgitation. She has a past history of PVCs. She has a past history of hyperlipidemia but because of favorable LDL particle size and low insulin resistance score she has not had to go on any statin therapy. SHe has a high HDL as well.   Because of her increased palpitations she underwent an 2-D echocardiogram on 07/29/14 which showed normal left ventricular systolic function with ejection fraction 60-65%.  There was trivial mitral regurgitation.. 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   She had a 48-hour Holter monitor in 2017 which did not show any significant arrhythmias.  She was in normal sinus rhythm.  There was no atrial fibrillation.  She had scattered premature atrial beats and premature ventricular beats.  Some of the premature beats were interpolated.  The patient previously had been on nadolol which lost efficacy.  We switched her to metoprolol succinate.  She initially was on 25 mg daily and we have increased it to 50 mg daily.    On this regimen she did fairly well with just occasional palpitations which did not bother her much. Instead of taking 50 mg every morning, she was switched to 25 mg twice a day.   Lipids were elevated in 5/17 at PMD and we decided to consider once a week statin.  She had a 2016 renal stone CT and heart was partially visualized by her husband and there was no  evidence of coronary calcium.  Therefore, we have not treated with a statin.  We discussed a coronary CT calcium scoring scan.     She is neighbors with Dr. Ellouise Newer. She had knee surgery from torn cartilage in 2/21.     2022 calcium score CT showed: "Ascending Aorta: Normal caliber   Pericardium: Normal   Coronary arteries: Normal origins   IMPRESSION: Coronary calcium score of 0. This is a low risk study."   Denies : Chest pain. Dizziness. Leg edema. Nitroglycerin use. Orthopnea. Palpitations. Paroxysmal nocturnal dyspnea. Syncope.     She had COVID in 4/22.  Some fatigue but this resolved.    She had travelled a lot of the past year.    Torn meniscus.  Limits his exercise.    Had some palpitations in 3/23.  Resolved.   Since March 2023, Denies : Chest pain. Dizziness. Leg edema. Nitroglycerin use. Orthopnea. Palpitations. Paroxysmal nocturnal dyspnea. Shortness of breath. Syncope.     Past Medical History:  Diagnosis Date   Bartholin's gland abscess    COVID 10/2020   Heart murmur    History of COVID-19 10/13/2020   Hyperlipidemia    has favorable LDL particle size and low insulin resistance score.   LGSIL (low grade squamous intraepithelial dysplasia)    MVP (mitral valve prolapse)    No antibiotics required   Palpitations  PVC's (premature ventricular contractions)    Shingles 10/22/2015   torso    Past Surgical History:  Procedure Laterality Date   APPENDECTOMY     AUGMENTATION MAMMAPLASTY Bilateral    BLEPHAROPLASTY Bilateral 10/2015   ---[facility]   CERVICAL BIOPSY  W/ LOOP ELECTRODE EXCISION  2006?   Had pap q 3 months all normal    CESAREAN SECTION     COLPOSCOPY     KNEE ARTHROSCOPY Right 06/17/2019   Procedure: RIGHT KNEE ARTHROSCOPY WITH PARTIAL MEDIAL MENISECTOMY;  Surgeon: Melrose Nakayama, MD;  Location: [facility];  Service: Orthopedics;  Laterality: Right;   TONSILLECTOMY AND ADENOIDECTOMY     US ECHOCARDIOGRAPHY  09/03/2006   EF  55-60%     Current Outpatient Medications  Medication Sig Dispense Refill   Calcium Carbonate-Vitamin D (CALCIUM 600 + D PO) Take 650 mg by mouth daily. ADORA     cholecalciferol (VITAMIN D) 1000 UNITS tablet Take 1,000 Units by mouth daily.       Coenzyme Q10 (COQ10 PO) Take 200 mg by mouth daily.      Cyanocobalamin (VITAMIN B 12 PO) Take 1,000 mcg by mouth daily.     metoprolol succinate (TOPROL-XL) 25 MG 24 hr tablet TAKE 1 TABLET TWICE A DAY 180 tablet 0   Omega-3 Fatty Acids (OMEGA-3 FISH OIL) 1200 MG CAPS Take 1 capsule by mouth daily.     Probiotic Product (SOLUBLE FIBER/PROBIOTICS PO) Take 1 capsule by mouth daily.      No current facility-administered medications for this visit.    Allergies:   Patient has no known allergies.    Social History:  The patient  reports that she has never smoked. She has never used smokeless tobacco. She reports current alcohol use of about 4.0 standard drinks of alcohol per week. She reports that she does not use drugs.   Family History:  The patient's family history includes Breast cancer in her paternal grandmother and sister; Dementia in her mother; Diabetes in her sister; Heart attack in her maternal grandfather; Heart failure in her father; Hypertension in her brother, father, and mother.    ROS:  Please see the history of present illness.   Otherwise, review of systems are positive for knee pain with certain activities.   All other systems are reviewed and negative.    PHYSICAL EXAM: VS:  BP 114/70   Pulse 71   Ht 5' 5.5" (1.664 m)   Wt 173 lb (78.5 kg)   LMP 05/15/2005 (Approximate)   SpO2 98%   BMI 28.35 kg/m  , BMI Body mass index is 28.35 kg/m. GEN: Well nourished, well developed, in no acute distress HEENT: normal Neck: no JVD, carotid bruits, or masses Cardiac: RRR; no murmurs, rubs, or gallops,no edema  Respiratory:  clear to auscultation bilaterally, normal work of breathing GI: soft, nontender, nondistended, + BS MS:  no deformity or atrophy Skin: warm and dry, no rash Neuro:  Strength and sensation are intact Psych: euthymic mood, full affect   EKG:   The ekg ordered today demonstrates NSR, PVCs, nonspecific ST flattening   Recent Labs: No results found for requested labs within last 365 days.   Lipid Panel    Component Value Date/Time   CHOL 250 (H) 03/20/2019 1128   TRIG 69 03/20/2019 1128   TRIG 57 10/03/2016 0810   HDL 73 03/20/2019 1128   HDL 79 10/03/2016 0810   CHOLHDL 3.4 03/20/2019 1128   CHOLHDL 2.6 12/09/2015 0740  VLDL 10 12/09/2015 0740   LDLCALC 166 (H) 03/20/2019 1128   LDLDIRECT 154.1 02/28/2013 0809     Other studies Reviewed: Additional studies/ records that were reviewed today with results demonstrating: labs reviewed with okay.   ASSESSMENT AND PLAN:  PAC/PVC: Had some brief symptoms in March 2023.  They resolved.  Continue current dose of metoprolol. Hyperlipidemia: LDL 145.  HDL 72.  Will recheck labs in January.  She tries to eat a healthy diet and hopefully be able to exercise more as her knee improves. MVP: No volume overload on exam.    Current medicines are reviewed at length with the patient today.  The patient concerns regarding her medicines were addressed.  The following changes have been made:  No change  Labs/ tests ordered today include:  No orders of the defined types were placed in this encounter.   Recommend 150 minutes/week of aerobic exercise Low fat, low carb, high fiber diet recommended  Disposition:   FU in 1 year   Signed, Larae Grooms, MD  03/21/2022 4:40 PM    [facility] [place] [address], [place], [place]  [address] Phone: [phone]; Fax: [phone]

## 2022-03-21 ENCOUNTER — Ambulatory Visit: Payer: Medicare Other | Attending: Interventional Cardiology | Admitting: Interventional Cardiology

## 2022-03-21 ENCOUNTER — Telehealth: Payer: Self-pay | Admitting: *Deleted

## 2022-03-21 ENCOUNTER — Encounter: Payer: Self-pay | Admitting: Interventional Cardiology

## 2022-03-21 VITALS — BP 114/70 | HR 71 | Ht 65.5 in | Wt 173.0 lb

## 2022-03-21 DIAGNOSIS — I491 Atrial premature depolarization: Secondary | ICD-10-CM | POA: Insufficient documentation

## 2022-03-21 DIAGNOSIS — I341 Nonrheumatic mitral (valve) prolapse: Secondary | ICD-10-CM | POA: Diagnosis present

## 2022-03-21 DIAGNOSIS — E782 Mixed hyperlipidemia: Secondary | ICD-10-CM

## 2022-03-21 DIAGNOSIS — I493 Ventricular premature depolarization: Secondary | ICD-10-CM | POA: Insufficient documentation

## 2022-03-21 NOTE — Patient Instructions (Signed)
Medication Instructions:  Your physician recommends that you continue on your current medications as directed. Please refer to the Current Medication list given to you today.  *If you need a refill on your cardiac medications before your next appointment, please call your pharmacy*   Lab Work: none If you have labs (blood work) drawn today and your tests are completely normal, you will receive your results only by: MyChart Message (if you have MyChart) OR A paper copy in the mail If you have any lab test that is abnormal or we need to change your treatment, we will call you to review the results.   Testing/Procedures: none   Follow-Up: At [facility], you and your health needs are our priority.  As part of our continuing mission to provide you with exceptional heart care, we have created designated Provider Care Teams.  These Care Teams include your primary Cardiologist (physician) and Advanced Practice Providers (APPs -  Physician Assistants and Nurse Practitioners) who all work together to provide you with the care you need, when you need it.  We recommend signing up for the patient portal called "MyChart".  Sign up information is provided on this After Visit Summary.  MyChart is used to connect with patients for Virtual Visits (Telemedicine).  Patients are able to view lab/test results, encounter notes, upcoming appointments, etc.  Non-urgent messages can be sent to your provider as well.   To learn more about what you can do with MyChart, go to [email or web address].    Your next appointment:   12 month(s)  The format for your next appointment:   In Person  Provider:   Jayadeep Varanasi, MD     Other Instructions    Important Information About Sugar       

## 2022-03-21 NOTE — Telephone Encounter (Signed)
Dr Irish Lack would like patient to have fasting lab work in January -CBC, CMET, lipids I spoke with patient and she will stop by office on May 17, 2022 for labs

## 2022-04-17 ENCOUNTER — Other Ambulatory Visit: Payer: Self-pay | Admitting: Internal Medicine

## 2022-04-17 ENCOUNTER — Telehealth: Payer: Self-pay

## 2022-04-17 ENCOUNTER — Telehealth: Payer: Self-pay | Admitting: *Deleted

## 2022-04-17 ENCOUNTER — Encounter: Payer: Self-pay | Admitting: Family Medicine

## 2022-04-17 DIAGNOSIS — Z1231 Encounter for screening mammogram for malignant neoplasm of breast: Secondary | ICD-10-CM

## 2022-04-17 NOTE — Telephone Encounter (Signed)
..     Pre-operative Risk Assessment    Patient Name: Virginia Tate  DOB: 1956-05-05 MRN: [id number]      Request for Surgical Clearance    Procedure:   LEFT KNEE ARTHROSCOPY  Date of Surgery:  Clearance TBD                                 Surgeon:  DR Monico Blitz DALLDORF Surgeon's Group or Practice Name:  [facility] Phone number:  [phone] Fax number:  [phone]   Type of Clearance Requested:   - Medical    Type of Anesthesia:  Spinal   Additional requests/questions:    Virginia Tate   04/17/2022, 2:44 PM

## 2022-04-17 NOTE — Telephone Encounter (Signed)
Addendum: actually had recent OV 03/21/22 so actually should be able to clear in regular phone visit tomorrow rather than tele visit. Will reach out to patient in AM. She clarified surgery is planned for January 2024.

## 2022-04-17 NOTE — Telephone Encounter (Signed)
Will send FYI to Dr. Rhona Raider the pt is not on any blood thinners. Pt has tele visit pre op clearance appt 05/05/22. Once the pt has been cleared we will be sure to fax notes over.

## 2022-04-17 NOTE — Telephone Encounter (Signed)
Pt has been scheduled for tele pre op appt 05/05/22 @ 2:40. Med rec and consent are done.     Patient Consent for Virtual Visit        Virginia Tate has provided verbal consent on 04/17/2022 for a virtual visit (video or telephone).   CONSENT FOR VIRTUAL VISIT FOR:  Virginia Tate  By participating in this virtual visit I agree to the following:  I hereby voluntarily request, consent and authorize [facility] and its employed or contracted physicians, physician assistants, nurse practitioners or other licensed health care professionals (the Practitioner), to provide me with telemedicine health care services (the "Services") as deemed necessary by the treating Practitioner. I acknowledge and consent to receive the Services by the Practitioner via telemedicine. I understand that the telemedicine visit will involve communicating with the Practitioner through live audiovisual communication technology and the disclosure of certain medical information by electronic transmission. I acknowledge that I have been given the opportunity to request an in-person assessment or other available alternative prior to the telemedicine visit and am voluntarily participating in the telemedicine visit.  I understand that I have the right to withhold or withdraw my consent to the use of telemedicine in the course of my care at any time, without affecting my right to future care or treatment, and that the Practitioner or I may terminate the telemedicine visit at any time. I understand that I have the right to inspect all information obtained and/or recorded in the course of the telemedicine visit and may receive copies of available information for a reasonable fee.  I understand that some of the potential risks of receiving the Services via telemedicine include:  Delay or interruption in medical evaluation due to technological equipment failure or disruption; Information transmitted may not be sufficient (e.g.  poor resolution of images) to allow for appropriate medical decision making by the Practitioner; and/or  In rare instances, security protocols could fail, causing a breach of personal health information.  Furthermore, I acknowledge that it is my responsibility to provide information about my medical history, conditions and care that is complete and accurate to the best of my ability. I acknowledge that Practitioner's advice, recommendations, and/or decision may be based on factors not within their control, such as incomplete or inaccurate data provided by me or distortions of diagnostic images or specimens that may result from electronic transmissions. I understand that the practice of medicine is not an exact science and that Practitioner makes no warranties or guarantees regarding treatment outcomes. I acknowledge that a copy of this consent can be made available to me via my patient portal ([facility]), or I can request a printed copy by calling the office of [facility].    I understand that my insurance will be billed for this visit.   I have read or had this consent read to me. I understand the contents of this consent, which adequately explains the benefits and risks of the Services being provided via telemedicine.  I have been provided ample opportunity to ask questions regarding this consent and the Services and have had my questions answered to my satisfaction. I give my informed consent for the services to be provided through the use of telemedicine in my medical care

## 2022-04-18 NOTE — Telephone Encounter (Signed)
   Patient Name: Virginia Tate  DOB: 07-Jul-1955 MRN: [id number]  Primary Cardiologist: Larae Grooms, MD  Chart reviewed as part of pre-operative protocol coverage. I erroneously deemed her needing virtual visit - her recent OV is still within the 1 month window, so I instead completed a standard phone call to ensure no changes from that visit. She has hx of MVP, PVCs, PACs, HLD, incidental atrial septal aneurysm, calcium score of zero. Doing well at follow-up visit. Last echo 12/2020 with normal LV function, mild LAE, mild MVP without MS/MR, borderline dilation of aortic root. Since preop clearance not explicitly addressed at [facility], reached out to patient to review. She denies any new symptoms from last OV and is doing great - actually currently exercising at the gym. She trains 2x/week and does pilates without any recent anginal symptoms. Therefore, based on ACC/AHA guidelines, the patient would be at acceptable risk for the planned procedure without further cardiovascular testing. The patient was advised that if she develops new symptoms prior to surgery to contact our office to arrange for a follow-up visit, and she verbalized understanding.  Will route this bundled recommendation to requesting provider via Epic fax function. Please call with questions.  Charlie Pitter, PA-C 04/18/2022, 9:37 AM

## 2022-04-18 NOTE — Telephone Encounter (Signed)
No need for virtual visit - able to clear in simple phone call today given last OV <1 month ago. Was able to clear in original phone note.

## 2022-04-19 ENCOUNTER — Other Ambulatory Visit: Payer: Self-pay | Admitting: Interventional Cardiology

## 2022-04-26 IMAGING — MG DIGITAL SCREENING BREAST BILAT IMPLANT W/ TOMO W/ CAD
8 of 14 series · 8 of 34 positions shown · non-contrast
Comparison: Previous exam(s).

CLINICAL DATA: Screening.

EXAM:
DIGITAL SCREENING BILATERAL MAMMOGRAM WITH IMPLANTS, CAD AND
TOMOSYNTHESIS
TECHNIQUE: Bilateral screening digital craniocaudal and mediolateral oblique
mammograms were obtained. Bilateral screening digital breast
tomosynthesis was performed. The images were evaluated with
computer-aided detection. Standard and/or implant displaced views
were performed.

[L MLO]
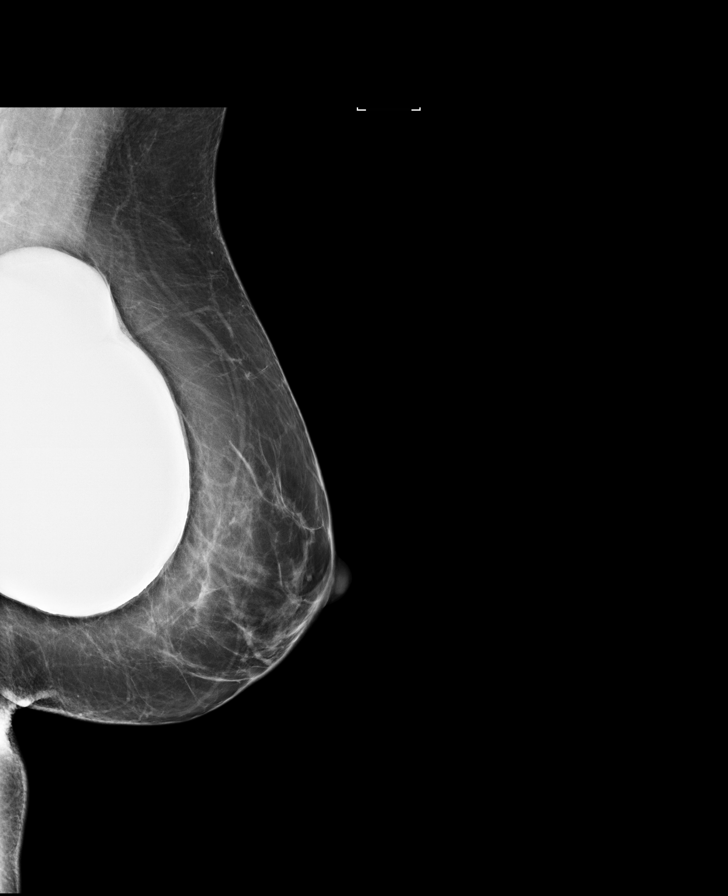

[R CC]
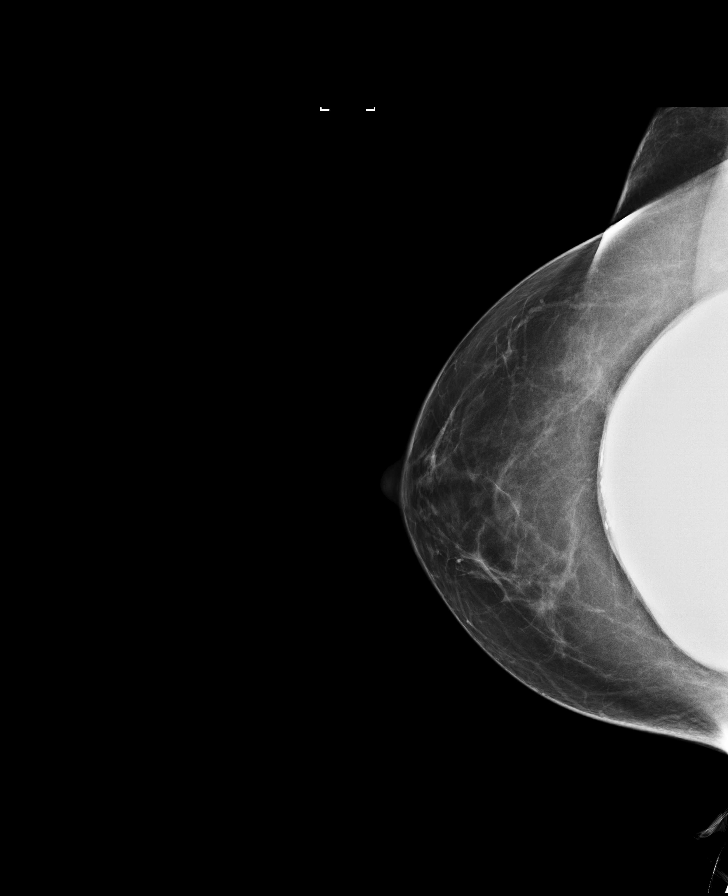

[L CC]
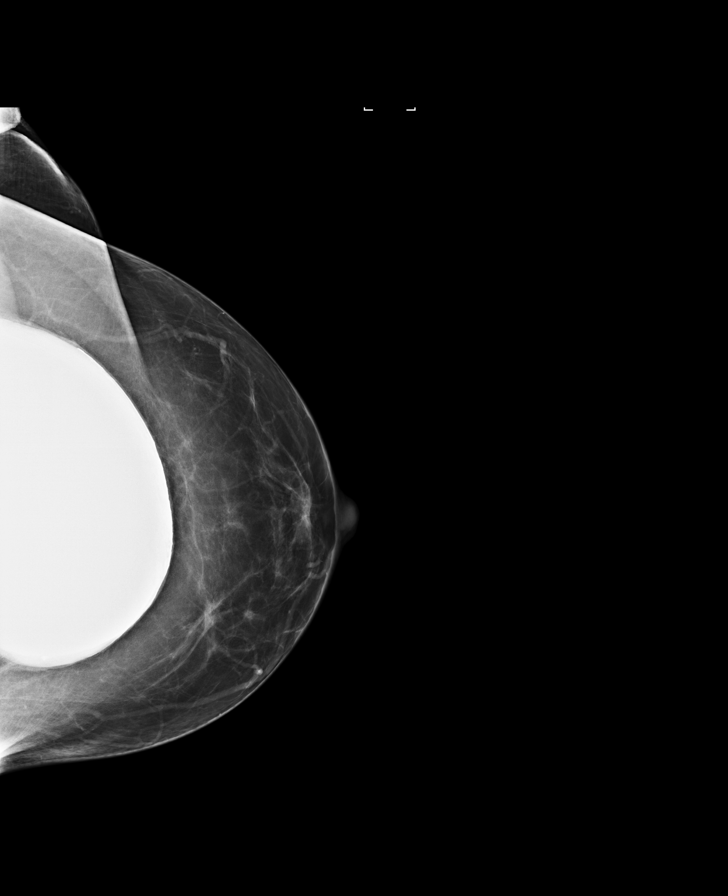

[R MLO]
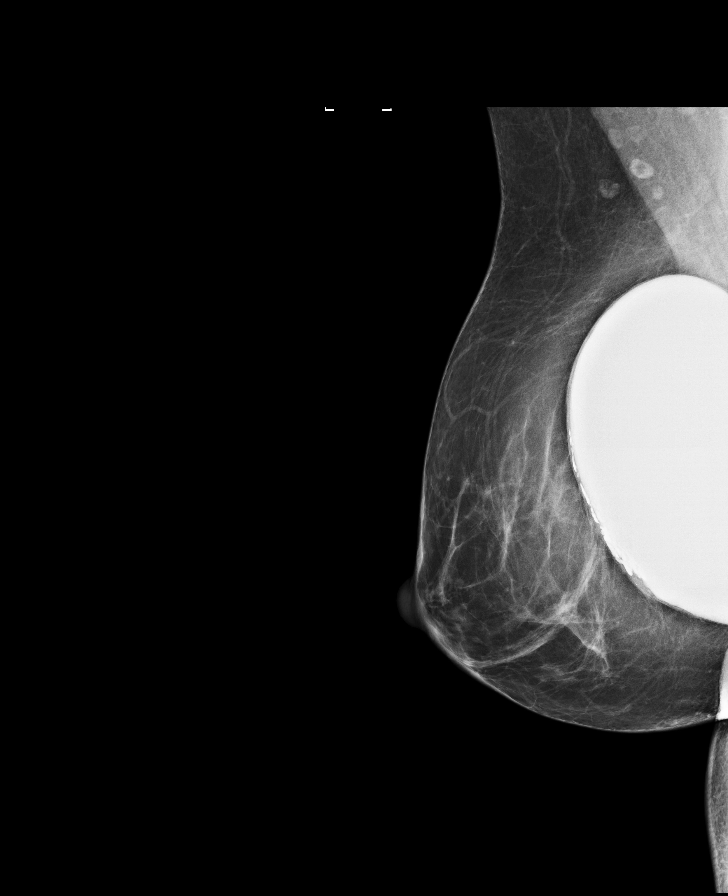

[R CC synth-2D]
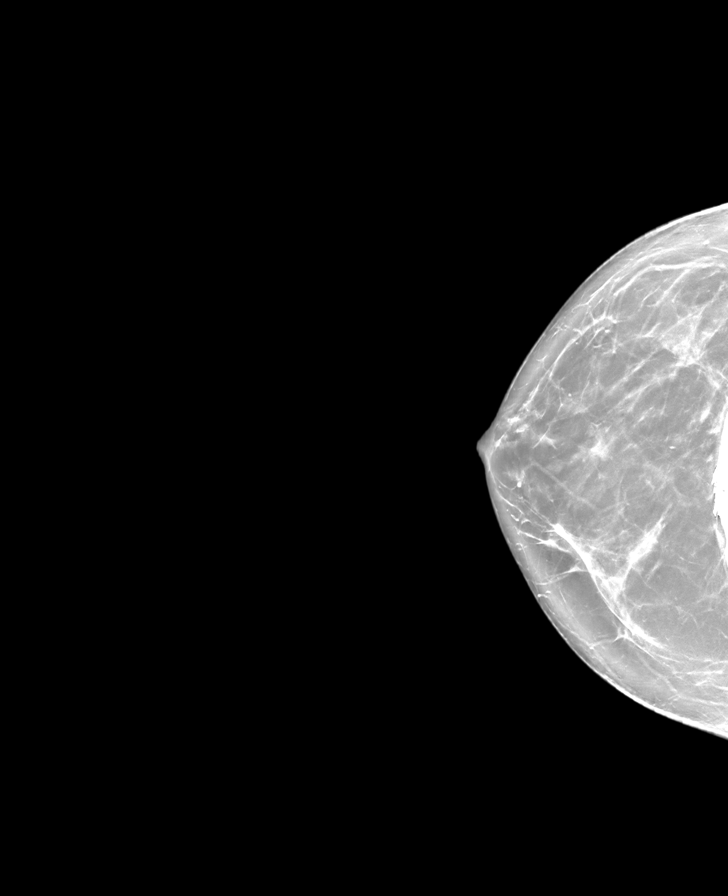

[L CC synth-2D (1 of 2)]
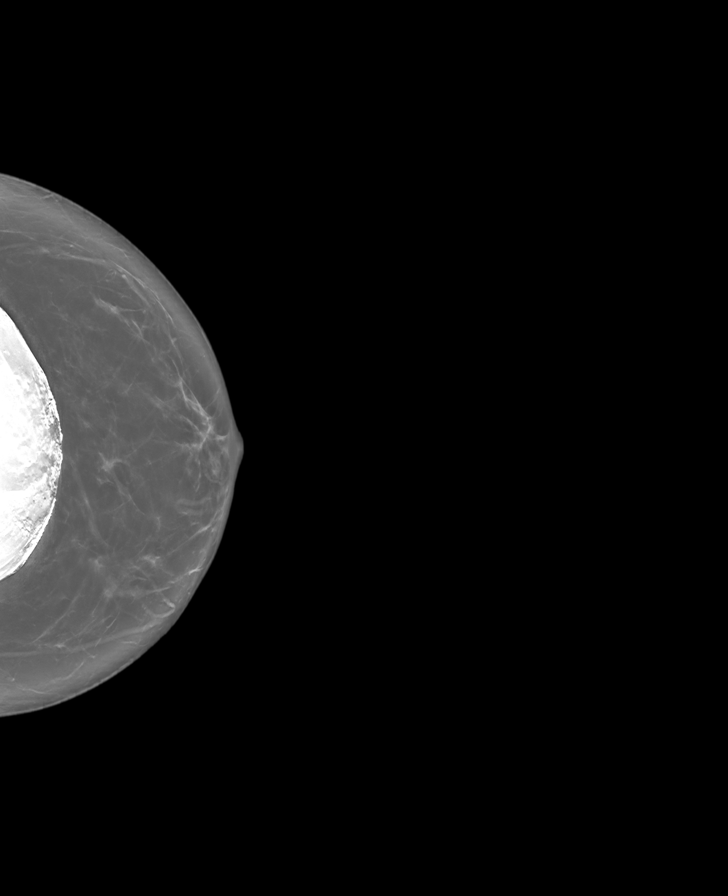

[L MLO synth-2D]
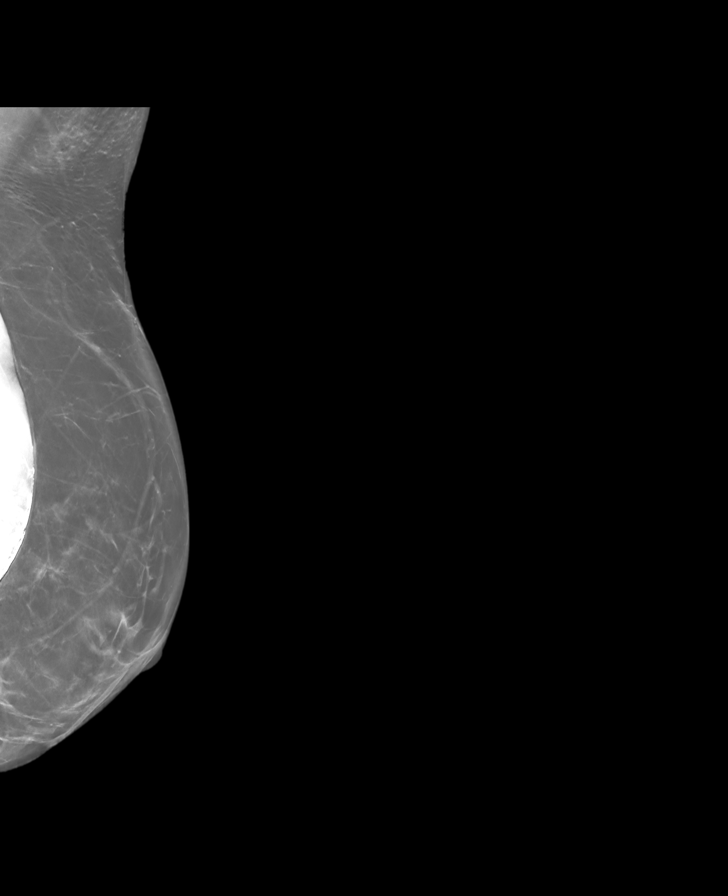

[L CC synth-2D (2 of 2)]
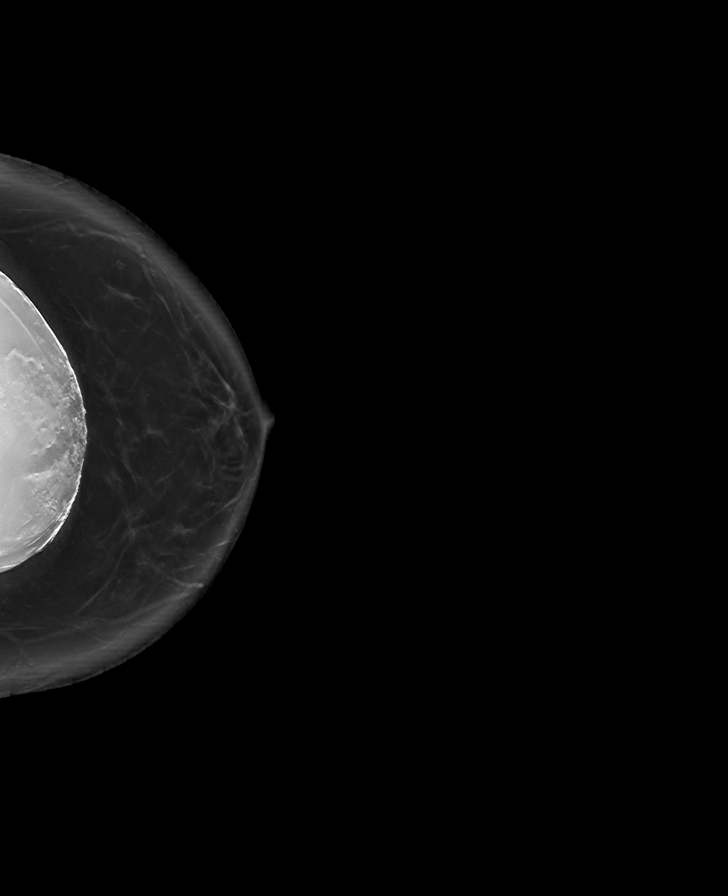

[8 of 34 positions shown; findings below may reference images not displayed]

ACR Breast Density Category b: There are scattered areas of
fibroglandular density.
FINDINGS: The patient has retroglandular silicone implants unchanged. There
are no findings suspicious for malignancy.
IMPRESSION: No mammographic evidence of malignancy. A result letter of this
screening mammogram will be mailed directly to the patient.

RECOMMENDATION:
Screening mammogram in one year. (Code:38-Z-AY6)

BI-RADS CATEGORY  1:  Negative.

## 2022-05-05 ENCOUNTER — Telehealth: Payer: Medicare Other

## 2022-05-17 ENCOUNTER — Ambulatory Visit: Payer: Medicare Other | Attending: Interventional Cardiology

## 2022-05-17 DIAGNOSIS — E782 Mixed hyperlipidemia: Secondary | ICD-10-CM

## 2022-05-17 DIAGNOSIS — I491 Atrial premature depolarization: Secondary | ICD-10-CM

## 2022-05-17 DIAGNOSIS — I493 Ventricular premature depolarization: Secondary | ICD-10-CM

## 2022-05-17 DIAGNOSIS — I341 Nonrheumatic mitral (valve) prolapse: Secondary | ICD-10-CM

## 2022-05-17 LAB — CBC

## 2022-05-18 LAB — COMPREHENSIVE METABOLIC PANEL
ALT: 18 IU/L (ref 0–32)
AST: 21 IU/L (ref 0–40)
Albumin/Globulin Ratio: 1.9 (ref 1.2–2.2)
Albumin: 4.4 g/dL (ref 3.9–4.9)
Alkaline Phosphatase: 76 IU/L (ref 44–121)
BUN/Creatinine Ratio: 17 (ref 12–28)
BUN: 16 mg/dL (ref 8–27)
Bilirubin Total: 0.7 mg/dL (ref 0.0–1.2)
CO2: 25 mmol/L (ref 20–29)
Calcium: 10 mg/dL (ref 8.7–10.3)
Chloride: 103 mmol/L (ref 96–106)
Creatinine, Ser: 0.93 mg/dL (ref 0.57–1.00)
Globulin, Total: 2.3 g/dL (ref 1.5–4.5)
Glucose: 101 mg/dL — ABNORMAL HIGH (ref 70–99)
Potassium: 4.4 mmol/L (ref 3.5–5.2)
Sodium: 140 mmol/L (ref 134–144)
Total Protein: 6.7 g/dL (ref 6.0–8.5)
eGFR: 67 mL/min/{1.73_m2} (ref >59)

## 2022-05-18 LAB — LIPID PANEL
Chol/HDL Ratio: 3.5 ratio (ref 0.0–4.4)
Cholesterol, Total: 272 mg/dL — ABNORMAL HIGH (ref 100–199)
HDL: 78 mg/dL (ref >39)
LDL Chol Calc (NIH): 182 mg/dL — ABNORMAL HIGH (ref 0–99)
Triglycerides: 73 mg/dL (ref 0–149)
VLDL Cholesterol Cal: 12 mg/dL (ref 5–40)

## 2022-05-18 LAB — CBC
Hematocrit: 44.4 % (ref 34.0–46.6)
Hemoglobin: 14.5 g/dL (ref 11.1–15.9)
MCH: 30.9 pg (ref 26.6–33.0)
MCHC: 32.7 g/dL (ref 31.5–35.7)
MCV: 95 fL (ref 79–97)
Platelets: 275 10*3/uL (ref 150–450)
RBC: 4.7 x10E6/uL (ref 3.77–5.28)
RDW: 12.3 % (ref 11.7–15.4)
WBC: 7.2 10*3/uL (ref 3.4–10.8)

## 2022-05-19 ENCOUNTER — Other Ambulatory Visit: Payer: Self-pay | Admitting: *Deleted

## 2022-05-19 DIAGNOSIS — E782 Mixed hyperlipidemia: Secondary | ICD-10-CM

## 2022-06-09 ENCOUNTER — Ambulatory Visit
Admission: RE | Admit: 2022-06-09 | Discharge: 2022-06-09 | Disposition: A | Discharge status: Home / Self Care | Payer: Medicare Other | Source: Ambulatory Visit | Attending: Internal Medicine | Admitting: Internal Medicine

## 2022-06-09 DIAGNOSIS — Z1231 Encounter for screening mammogram for malignant neoplasm of breast: Secondary | ICD-10-CM

## 2022-07-20 ENCOUNTER — Encounter: Payer: Self-pay | Admitting: Interventional Cardiology

## 2022-07-21 ENCOUNTER — Other Ambulatory Visit: Payer: Self-pay | Admitting: Internal Medicine

## 2022-07-21 DIAGNOSIS — Z13820 Encounter for screening for osteoporosis: Secondary | ICD-10-CM

## 2022-07-21 DIAGNOSIS — E2839 Other primary ovarian failure: Secondary | ICD-10-CM

## 2022-09-22 ENCOUNTER — Other Ambulatory Visit: Payer: Self-pay | Admitting: Interventional Cardiology

## 2023-01-12 ENCOUNTER — Ambulatory Visit: Admission: RE | Admit: 2023-01-12 | Payer: Medicare Other | Source: Ambulatory Visit

## 2023-01-12 DIAGNOSIS — E2839 Other primary ovarian failure: Secondary | ICD-10-CM

## 2023-01-12 DIAGNOSIS — Z13820 Encounter for screening for osteoporosis: Secondary | ICD-10-CM

## 2023-02-24 ENCOUNTER — Other Ambulatory Visit: Payer: Self-pay | Admitting: Interventional Cardiology

## 2023-03-11 NOTE — Progress Notes (Unsigned)
Cardiology Office Note:    Date:  03/12/2023   ID:  Virginia Tate, DOB 1955-09-15, MRN [id number]  PCP:  Maurice Small, MD (Inactive)  Cardiologist:  Lance Muss, MD  Electrophysiologist:  None   Referring MD: No ref. provider found   Chief Complaint  Patient presents with   Hyperlipidemia    History of Present Illness:    Virginia Tate is a 67 y.o. female with a hx of mitral valve prolapse, hyperlipidemia who presents for follow-up.  She previously followed with Dr. Eldridge Dace, last seen 03/2022.  Echocardiogram 12/13/2020 showed EF 55 to 60%, grade 1 diastolic dysfunction, no significant valvular disease.  Calcium score 05/24/2020 was 0.  She reports she has palpitations for short duration.  Denies any chest pain, dyspnea, lightheadedness, syncope, lower extremity edema.  Reports she does Pilates twice per week and also works out with a trainer twice per week, denies any exertional symptoms   Past Medical History:  Diagnosis Date   Bartholin's gland abscess    COVID 10/2020   Heart murmur    History of COVID-19 10/13/2020   Hyperlipidemia    has favorable LDL particle size and low insulin resistance score.   LGSIL (low grade squamous intraepithelial dysplasia)    MVP (mitral valve prolapse)    No antibiotics required   Palpitations    PVC's (premature ventricular contractions)    Shingles 10/22/2015   torso    Past Surgical History:  Procedure Laterality Date   APPENDECTOMY     AUGMENTATION MAMMAPLASTY Bilateral    BLEPHAROPLASTY Bilateral 10/2015   ---[facility]   CERVICAL BIOPSY  W/ LOOP ELECTRODE EXCISION  2006?   Had pap q 3 months all normal    CESAREAN SECTION     COLPOSCOPY     KNEE ARTHROSCOPY Right 06/17/2019   Procedure: RIGHT KNEE ARTHROSCOPY WITH PARTIAL MEDIAL MENISECTOMY;  Surgeon: Marcene Corning, MD;  Location: [facility];  Service: Orthopedics;  Laterality: Right;   TONSILLECTOMY AND ADENOIDECTOMY     US ECHOCARDIOGRAPHY   09/03/2006   EF 55-60%    Current Medications: Current Meds  Medication Sig   Calcium Carbonate-Vitamin D (CALCIUM 600 + D PO) Take 650 mg by mouth daily. ADORA   cholecalciferol (VITAMIN D) 1000 UNITS tablet Take 1,000 Units by mouth daily.   Coenzyme Q10 (COQ10 PO) Take 200 mg by mouth daily.   Probiotic Product (SOLUBLE FIBER/PROBIOTICS PO) Take 1 capsule by mouth daily.   rosuvastatin (CRESTOR) 10 MG tablet Take 1/2 tablet ( 5 mg ) daily   [DISCONTINUED] metoprolol succinate (TOPROL-XL) 25 MG 24 hr tablet TAKE 1 TABLET TWICE A DAY     Allergies:   Patient has no known allergies.   Social History   Socioeconomic History   Marital status: Married    Spouse name: Not on file   Number of children: Not on file   Years of education: Not on file   Highest education level: Not on file  Occupational History   Not on file  Tobacco Use   Smoking status: Never   Smokeless tobacco: Never  Vaping Use   Vaping status: Never Used  Substance and Sexual Activity   Alcohol use: Yes    Alcohol/week: 4.0 standard drinks of alcohol    Types: 4 Standard drinks or equivalent per week    Comment: socially   Drug use: No   Sexual activity: Yes    Partners: Male    Birth control/protection: Post-menopausal  Comment: first intercourse 70  Other Topics Concern   Not on file  Social History Narrative   Not on file   Social Determinants of Health   Financial Resource Strain: Not on file  Food Insecurity: Not on file  Transportation Needs: Not on file  Physical Activity: Not on file  Stress: Not on file  Social Connections: Not on file     Family History: The patient's family history includes Breast cancer in her paternal grandmother and sister; Dementia in her mother; Diabetes in her sister; Heart attack in her maternal grandfather; Heart failure in her father; Hypertension in her brother, father, and mother.  ROS:   Please see the history of present illness.     All other systems  reviewed and are negative.  EKGs/Labs/Other Studies Reviewed:    The following studies were reviewed today:   EKG:   03/12/2023: Sinus bradycardia, rate 56, poor R wave progression  Recent Labs: 05/17/2022: ALT 18; BUN 16; Creatinine, Ser 0.93; Hemoglobin 14.5; Platelets 275; Potassium 4.4; Sodium 140  Recent Lipid Panel    Component Value Date/Time   CHOL 272 (H) 05/17/2022 0826   TRIG 73 05/17/2022 0826   TRIG 57 10/03/2016 0810   HDL 78 05/17/2022 0826   HDL 79 10/03/2016 0810   CHOLHDL 3.5 05/17/2022 0826   CHOLHDL 2.6 12/09/2015 0740   VLDL 10 12/09/2015 0740   LDLCALC 182 (H) 05/17/2022 0826   LDLDIRECT 154.1 02/28/2013 0809    Physical Exam:    VS:  BP (!) 140/82 (BP Location: Right Arm, Patient Position: Sitting, Cuff Size: Normal)   Pulse (!) 56   Ht 5\' 6"  (1.676 m)   Wt 175 lb (79.4 kg)   LMP 05/15/2005 (Approximate)   SpO2 97%   BMI 28.25 kg/m     Wt Readings from Last 3 Encounters:  03/12/23 175 lb (79.4 kg)  03/21/22 173 lb (78.5 kg)  03/30/21 167 lb (75.8 kg)     GEN:  Well nourished, well developed in no acute distress HEENT: Normal NECK: No JVD; No carotid bruits LYMPHATICS: No lymphadenopathy CARDIAC: RRR, no murmurs, rubs, gallops RESPIRATORY:  Clear to auscultation without rales, wheezing or rhonchi  ABDOMEN: Soft, non-tender, non-distended MUSCULOSKELETAL:  No edema; No deformity  SKIN: Warm and dry NEUROLOGIC:  Alert and oriented x 3 PSYCHIATRIC:  Normal affect   ASSESSMENT:    1. Mixed hyperlipidemia   2. Palpitation   3. MVP (mitral valve prolapse)    PLAN:    MVP: Mild mitral valve prolapse, no significant mitral regurgitation on echo  Hyperlipidemia: LDL 182 on 05/17/2022.  Calcium score 0 in 2022.   10-year ASCVD risk score 10%.  Meets indication for starting statin, recommend starting rosuvastatin 5 mg daily and check fasting lipid panel in 2 months.  Will also check LPa level  Palpitations: Continue Toprol XL 25 mg  daily  RTC in 1 year  Medication Adjustments/Labs and Tests Ordered: Current medicines are reviewed at length with the patient today.  Concerns regarding medicines are outlined above.  Orders Placed This Encounter  Procedures   Lipid panel   Lipoprotein A (LPA)   EKG 12-Lead   Meds ordered this encounter  Medications   metoprolol succinate (TOPROL-XL) 25 MG 24 hr tablet    Sig: Take 1 tablet (25 mg total) by mouth 2 (two) times daily.    Dispense:  180 tablet    Refill:  3   rosuvastatin (CRESTOR) 10 MG tablet  Sig: Take 1/2 tablet ( 5 mg ) daily    Dispense:  90 tablet    Refill:  3    Patient Instructions  Medication Instructions:  Start Rosuvastatin 10 mg take 1/2 tablet ( 5 mg ) daily Continue all other medications *If you need a refill on your cardiac medications before your next appointment, please call your pharmacy*   Lab Work: Have lipid panel and LPa in 2 months   Testing/Procedures: None ordered   Follow-Up: At [facility], you and your health needs are our priority.  As part of our continuing mission to provide you with exceptional heart care, we have created designated Provider Care Teams.  These Care Teams include your primary Cardiologist (physician) and Advanced Practice Providers (APPs -  Physician Assistants and Nurse Practitioners) who all work together to provide you with the care you need, when you need it.  We recommend signing up for the patient portal called "MyChart".  Sign up information is provided on this After Visit Summary.  MyChart is used to connect with patients for Virtual Visits (Telemedicine).  Patients are able to view lab/test results, encounter notes, upcoming appointments, etc.  Non-urgent messages can be sent to your provider as well.   To learn more about what you can do with MyChart, go to [email or web address].    Your next appointment:  1 year    Call in June to schedule Oct appointment     Provider:   Dr.Andreya Lacks    Check blood pressure twice a day for 1 week Send readings through [employer], Little Ishikawa, MD  03/12/2023 10:29 PM    [facility]

## 2023-03-12 ENCOUNTER — Ambulatory Visit: Payer: Medicare Other | Attending: Cardiology | Admitting: Cardiology

## 2023-03-12 ENCOUNTER — Encounter: Payer: Self-pay | Admitting: Cardiology

## 2023-03-12 VITALS — BP 140/82 | HR 56 | Ht 66.0 in | Wt 175.0 lb

## 2023-03-12 DIAGNOSIS — R002 Palpitations: Secondary | ICD-10-CM

## 2023-03-12 DIAGNOSIS — I341 Nonrheumatic mitral (valve) prolapse: Secondary | ICD-10-CM

## 2023-03-12 DIAGNOSIS — E782 Mixed hyperlipidemia: Secondary | ICD-10-CM

## 2023-03-12 MED ORDER — METOPROLOL SUCCINATE ER 25 MG PO TB24: 25.0000 mg | ORAL_TABLET | Freq: Two times a day (BID) | ORAL | 3 refills | Status: DC

## 2023-03-12 MED ORDER — ROSUVASTATIN CALCIUM 10 MG PO TABS: ORAL_TABLET | ORAL | 3 refills | Status: DC

## 2023-03-12 NOTE — Patient Instructions (Signed)
Medication Instructions:  Start Rosuvastatin 10 mg take 1/2 tablet ( 5 mg ) daily Continue all other medications *If you need a refill on your cardiac medications before your next appointment, please call your pharmacy*   Lab Work: Have lipid panel and LPa in 2 months   Testing/Procedures: None ordered   Follow-Up: At [facility], you and your health needs are our priority.  As part of our continuing mission to provide you with exceptional heart care, we have created designated Provider Care Teams.  These Care Teams include your primary Cardiologist (physician) and Advanced Practice Providers (APPs -  Physician Assistants and Nurse Practitioners) who all work together to provide you with the care you need, when you need it.  We recommend signing up for the patient portal called "MyChart".  Sign up information is provided on this After Visit Summary.  MyChart is used to connect with patients for Virtual Visits (Telemedicine).  Patients are able to view lab/test results, encounter notes, upcoming appointments, etc.  Non-urgent messages can be sent to your provider as well.   To learn more about what you can do with MyChart, go to [email or web address].    Your next appointment:  1 year    Call in June to schedule Oct appointment     Provider:  Dr.Schumann    Check blood pressure twice a day for 1 week Send readings through [employer]

## 2023-03-19 ENCOUNTER — Encounter: Payer: Self-pay | Admitting: Cardiology

## 2023-04-27 ENCOUNTER — Other Ambulatory Visit: Payer: Self-pay | Admitting: Internal Medicine

## 2023-04-27 DIAGNOSIS — Z0000 Encounter for general adult medical examination without abnormal findings: Secondary | ICD-10-CM

## 2023-05-15 LAB — LIPID PANEL
Chol/HDL Ratio: 2.4 {ratio} (ref 0.0–4.4)
Cholesterol, Total: 171 mg/dL (ref 100–199)
HDL: 71 mg/dL (ref >39)
LDL Chol Calc (NIH): 88 mg/dL (ref 0–99)
Triglycerides: 62 mg/dL (ref 0–149)
VLDL Cholesterol Cal: 12 mg/dL (ref 5–40)

## 2023-05-15 LAB — LIPOPROTEIN A (LPA): Lipoprotein (a): 28.9 nmol/L (ref <75.0)

## 2023-06-06 NOTE — Progress Notes (Signed)
 68 y.o. G40P2003 Married Caucasian female here for a breast and pelvic exam.    Followed for vaginal atrophy.  Not using vit E.  Lubricant is adequate.  No vaginal or spotting.   Knee surgery a year ago.   Back from [place].   PCP: Vernon Velna SAUNDERS, MD   Patient's last menstrual period was 05/15/2005 (approximate).           Sexually active: Yes.    The current method of family planning is post menopausal status.    Menopausal hormone therapy:  n/a Exercising: Yes.    Pilates and personal trainer Smoker:  no  OB History     Gravida  2   Para  2   Term  2   Preterm      AB      Living  3      SAB      IAB      Ectopic      Multiple  1   Live Births              HEALTH MAINTENANCE: Last 2 paps: 03/30/21 neg, 09/30/18 neg: HR HPV neg, 03-20-18 ASCUS:Neg HR HPV, 03-12-17 Neg:Neg HR HPV  History of abnormal Pap or positive HPV:  yes, Hx of LEEP with Dr. Norval 2006 on chart review, had pap q 3 months for a year all paps were normal   Mammogram:  02-26-20 3D, scheduled 06/21/23 TBCw/Implants/Neg/BiRads1.  Colonoscopy: 2021 normal - due in 2031.  Bone Density:  01/12/23  Result  normal   Immunization History  Administered Date(s) Administered   Influenza Split 01/19/2015, 02/13/2016, 01/29/2017   Influenza-Unspecified 02/13/2019, 02/21/2020, 03/01/2021   PFIZER(Purple Top)SARS-COV-2 Vaccination 06/05/2019, 06/26/2019, 01/08/2020   Pneumococcal Conjugate-13 03/15/2016   Pneumococcal Polysaccharide-23 04/03/2019   Tdap 08/19/2010   Zoster, Live 02/18/2015, 04/03/2018, 06/25/2018      reports that she has never smoked. She has never been exposed to tobacco smoke. She has never used smokeless tobacco. She reports current alcohol use of about 4.0 standard drinks of alcohol per week. She reports that she does not use drugs.  Past Medical History:  Diagnosis Date   Bartholin's gland abscess    COVID 10/2020   Heart murmur    History of  COVID-19 10/13/2020   Hyperlipidemia    has favorable LDL particle size and low insulin resistance score.   LGSIL (low grade squamous intraepithelial dysplasia)    MVP (mitral valve prolapse)    No antibiotics required   Palpitations    PVC's (premature ventricular contractions)    Shingles 10/22/2015   torso    Past Surgical History:  Procedure Laterality Date   APPENDECTOMY     AUGMENTATION MAMMAPLASTY Bilateral    BLEPHAROPLASTY Bilateral 10/2015   ---[facility]   CERVICAL BIOPSY  W/ LOOP ELECTRODE EXCISION  2006?   Had pap q 3 months all normal    CESAREAN SECTION     COLPOSCOPY     KNEE ARTHROSCOPY Right 06/17/2019   Procedure: RIGHT KNEE ARTHROSCOPY WITH PARTIAL MEDIAL MENISECTOMY;  Surgeon: Sheril Coy, MD;  Location: [facility];  Service: Orthopedics;  Laterality: Right;   KNEE ARTHROSCOPY Left    06/2022   TONSILLECTOMY AND ADENOIDECTOMY     US  ECHOCARDIOGRAPHY  09/03/2006   EF 55-60%    Current Outpatient Medications  Medication Sig Dispense Refill   Calcium  Carbonate-Vitamin D  (CALCIUM  600 + D PO) Take 650 mg by mouth daily. ADORA  cholecalciferol (VITAMIN D ) 1000 UNITS tablet Take 1,000 Units by mouth daily.     Coenzyme Q10 (COQ10 PO) Take 200 mg by mouth daily.     metoprolol  succinate (TOPROL -XL) 25 MG 24 hr tablet Take 1 tablet (25 mg total) by mouth 2 (two) times daily. 180 tablet 3   Probiotic Product (SOLUBLE FIBER/PROBIOTICS PO) Take 1 capsule by mouth daily.     rosuvastatin  (CRESTOR ) 10 MG tablet Take 1/2 tablet ( 5 mg ) daily 90 tablet 3   Cyanocobalamin (VITAMIN B 12 PO) Take 1,000 mcg by mouth daily. (Patient not taking: Reported on 06/20/2023)     No current facility-administered medications for this visit.    ALLERGIES: Patient has no known allergies.  Family History  Problem Relation Age of Onset   Heart failure Father    Hypertension Father    Hypertension Mother    Dementia Mother    Diabetes Sister    Breast cancer  Sister        Age 49   Hypertension Brother    Breast cancer Paternal Grandmother        Age 69's   Heart attack Maternal Grandfather     Review of Systems  All other systems reviewed and are negative.   PHYSICAL EXAM:  BP 102/60 (BP Location: Left Arm, Patient Position: Sitting, Cuff Size: Normal)   Pulse 63   Ht 5' 5.5 (1.664 m)   Wt 170 lb (77.1 kg)   LMP 05/15/2005 (Approximate)   SpO2 96%   BMI 27.86 kg/m     General appearance: alert, cooperative and appears stated age Head: normocephalic, without obvious abnormality, atraumatic Neck: no adenopathy, supple, symmetrical, trachea midline and thyroid normal to inspection and palpation Lungs: clear to auscultation bilaterally Breasts: normal appearance, bilateral implants, no masses or tenderness, No nipple retraction or dimpling, No nipple discharge or bleeding, No axillary adenopathy Heart: regular rate and rhythm Abdomen: soft, non-tender; no masses, no organomegaly Extremities: extremities normal, atraumatic, no cyanosis or edema Skin: skin color, texture, turgor normal. No rashes or lesions Lymph nodes: cervical, supraclavicular, and axillary nodes normal. Neurologic: grossly normal  Pelvic: External genitalia:  no lesions              No abnormal inguinal nodes palpated.              Urethra:  normal appearing urethra with no masses, tenderness or lesions              Bartholins and Skenes: normal                 Vagina: normal appearing vagina with normal color and discharge, no lesions              Cervix: no lesions              Pap taken: No. Bimanual Exam:  Uterus:  normal size, contour, position, consistency, mobility, non-tender              Adnexa: no mass, fullness, tenderness              Rectal exam: Yes.  .  Confirms.              Anus:  normal sphincter tone, no lesions  Chaperone was present for exam:  Darice BROCKS, CMA  ASSESSMENT: Encounter for breast and pelvic exam.  Bilateral breast implants.   Hx LEEP.  2006 on chart review. Hx positive HR HPV in 2015, with negative follow  up Hr HPV testing.   Vaginal atrophy.   PLAN: Mammogram screening discussed. Self breast awareness reviewed. Pap and HRV collected:  No.  Will do in 2 years. Guidelines for Calcium , Vitamin D , regular exercise program including cardiovascular and weight bearing exercise. Medication refills:  NA Labs and vaccines with PCP.  Follow up:  2 years and prn.

## 2023-06-20 ENCOUNTER — Ambulatory Visit (INDEPENDENT_AMBULATORY_CARE_PROVIDER_SITE_OTHER): Payer: Medicare Other | Admitting: Obstetrics and Gynecology

## 2023-06-20 ENCOUNTER — Encounter: Payer: Self-pay | Admitting: Obstetrics and Gynecology

## 2023-06-20 VITALS — BP 102/60 | HR 63 | Ht 65.5 in | Wt 170.0 lb

## 2023-06-20 DIAGNOSIS — Z9189 Other specified personal risk factors, not elsewhere classified: Secondary | ICD-10-CM

## 2023-06-20 DIAGNOSIS — N952 Postmenopausal atrophic vaginitis: Secondary | ICD-10-CM

## 2023-06-20 DIAGNOSIS — Z8742 Personal history of other diseases of the female genital tract: Secondary | ICD-10-CM

## 2023-06-20 DIAGNOSIS — Z01419 Encounter for gynecological examination (general) (routine) without abnormal findings: Secondary | ICD-10-CM

## 2023-06-20 NOTE — Patient Instructions (Signed)

## 2023-06-21 ENCOUNTER — Ambulatory Visit
Admission: RE | Admit: 2023-06-21 | Discharge: 2023-06-21 | Disposition: A | Discharge status: Home / Self Care | Payer: Medicare Other | Source: Ambulatory Visit | Attending: Internal Medicine | Admitting: Internal Medicine

## 2023-06-21 DIAGNOSIS — Z0000 Encounter for general adult medical examination without abnormal findings: Secondary | ICD-10-CM

## 2023-06-27 ENCOUNTER — Other Ambulatory Visit: Payer: Self-pay | Admitting: Internal Medicine

## 2023-06-27 DIAGNOSIS — R928 Other abnormal and inconclusive findings on diagnostic imaging of breast: Secondary | ICD-10-CM

## 2023-07-06 ENCOUNTER — Ambulatory Visit
Admission: RE | Admit: 2023-07-06 | Discharge: 2023-07-06 | Disposition: A | Discharge status: Home / Self Care | Payer: Medicare Other | Source: Ambulatory Visit | Attending: Internal Medicine | Admitting: Internal Medicine

## 2023-07-06 ENCOUNTER — Ambulatory Visit: Payer: Medicare Other

## 2023-07-06 DIAGNOSIS — R928 Other abnormal and inconclusive findings on diagnostic imaging of breast: Secondary | ICD-10-CM

## 2024-01-29 ENCOUNTER — Other Ambulatory Visit: Payer: Self-pay | Admitting: Medical Genetics

## 2024-03-18 ENCOUNTER — Other Ambulatory Visit: Payer: Self-pay | Admitting: Cardiology

## 2024-03-20 MED ORDER — METOPROLOL SUCCINATE ER 25 MG PO TB24: 25.0000 mg | ORAL_TABLET | Freq: Two times a day (BID) | ORAL | 0 refills | Status: AC

## 2024-03-21 ENCOUNTER — Other Ambulatory Visit: Payer: Self-pay

## 2024-03-21 ENCOUNTER — Emergency Department (HOSPITAL_BASED_OUTPATIENT_CLINIC_OR_DEPARTMENT_OTHER)
Admission: EM | Admit: 2024-03-21 | Discharge: 2024-03-21 | Disposition: A | Discharge status: Home / Self Care | Attending: Emergency Medicine | Admitting: Emergency Medicine

## 2024-03-21 ENCOUNTER — Emergency Department (HOSPITAL_BASED_OUTPATIENT_CLINIC_OR_DEPARTMENT_OTHER)

## 2024-03-21 ENCOUNTER — Encounter (HOSPITAL_BASED_OUTPATIENT_CLINIC_OR_DEPARTMENT_OTHER): Payer: Self-pay

## 2024-03-21 DIAGNOSIS — S0181XA Laceration without foreign body of other part of head, initial encounter: Secondary | ICD-10-CM | POA: Insufficient documentation

## 2024-03-21 DIAGNOSIS — W228XXA Striking against or struck by other objects, initial encounter: Secondary | ICD-10-CM | POA: Diagnosis not present

## 2024-03-21 DIAGNOSIS — S01512A Laceration without foreign body of oral cavity, initial encounter: Secondary | ICD-10-CM | POA: Diagnosis not present

## 2024-03-21 DIAGNOSIS — S0993XA Unspecified injury of face, initial encounter: Secondary | ICD-10-CM | POA: Diagnosis present

## 2024-03-21 MED ORDER — LIDOCAINE-EPINEPHRINE (PF) 2 %-1:200000 IJ SOLN: 20.0000 mL | Freq: Once | INTRAMUSCULAR | Status: AC

## 2024-03-21 MED ORDER — LIDOCAINE-EPINEPHRINE (PF) 2 %-1:200000 IJ SOLN
INTRAMUSCULAR | Status: AC
  Administered 2024-03-21: 20 mL
  Filled 2024-03-21: qty 20

## 2024-03-21 NOTE — ED Triage Notes (Signed)
 Patient reports a metal spring loaded bar hit her in the chin while at piliates. Has a ~2.5 cm laceration on chin.

## 2024-03-21 NOTE — Discharge Instructions (Signed)
 CAT scan was normal.  Confirm with your doctor that your tetanus shot is up-to-date.  Otherwise avoid salty or acidic foods or foods with sharp edges.  Rinse your mouth after eating.  Stitches need to come out in about 5 days but you could extend it out to 7 if the area is not completely healed

## 2024-03-21 NOTE — ED Provider Notes (Signed)
 [place] EMERGENCY DEPARTMENT AT [facility] Provider Note   CSN: [id number] Arrival date & time: 03/21/24  9150     Patient presents with: Laceration   Virginia Tate is a 68 y.o. female.   Patient is a 68 year old female with a history of palpitations and hyperlipidemia who is presenting today after an injury while she was at [employer].  A bar swung back and hit her in the chin and mouth area.  She reports no loosening of her teeth but does have a laceration on the outside of the face and inside of the mouth.  Now she is also having pain in her left TMJ area.  No loss of consciousness and no neck pain.  She is able to swallow without difficulty.  She is not take any anticoagulation.  Tetanus shot  The history is provided by the patient.  Laceration      Prior to Admission medications   Medication Sig Start Date End Date Taking? Authorizing Provider  Calcium  Carbonate-Vitamin D  (CALCIUM  600 + D PO) Take 650 mg by mouth daily. ADORA    [provider]  cholecalciferol (VITAMIN D ) 1000 UNITS tablet Take 1,000 Units by mouth daily.    [provider]  Coenzyme Q10 (COQ10 PO) Take 200 mg by mouth daily.    [provider]  metoprolol  succinate (TOPROL -XL) 25 MG 24 hr tablet Take 1 tablet (25 mg total) by mouth 2 (two) times daily. 03/20/24   [name], MD  Probiotic Product (SOLUBLE FIBER/PROBIOTICS PO) Take 1 capsule by mouth daily.    [provider]  rosuvastatin  (CRESTOR ) 10 MG tablet Take 1/2 tablet ( 5 mg ) daily 03/12/23   [name], MD    Allergies: Patient has no known allergies.    Review of Systems  Updated Vital Signs BP (!) 146/83 (BP Location: Right Arm)   Pulse 66   Temp 97.9 F (36.6 C) (Temporal)   Resp 16   LMP 05/15/2005 (Approximate)   SpO2 100%   Physical Exam Vitals reviewed.  HENT:     Head: Normocephalic.      Mouth/Throat:     Comments: 3 cm vertical laceration on the  inside of the mouth.  Dentition is normal.  Pain with palpation over the left TMJ but normal opening and closing of the mouth. Pulmonary:     Effort: Pulmonary effort is normal.  Neurological:     Mental Status: She is alert. Mental status is at baseline.     (all labs ordered are listed, but only abnormal results are displayed) Labs Reviewed - No data to display  EKG: None  Radiology: CT Maxillofacial Wo Contrast Result Date: 03/21/2024 EXAM: CT OF THE FACE WITHOUT CONTRAST 03/21/2024 10:05:55 AM TECHNIQUE: CT of the face was performed without the administration of intravenous contrast. Multiplanar reformatted images are provided for review. Automated exposure control, iterative reconstruction, and/or weight based adjustment of the mA/kV was utilized to reduce the radiation dose to as low as reasonably achievable. COMPARISON: None available. CLINICAL HISTORY: 67 year old female with blunt facial trauma and a 2.5 cm chin laceration. FINDINGS: FACIAL BONES: Mandible and maxilla appear intact. No acute dental finding. No mandibular dislocation. No suspicious bone lesion. ORBITS: Globes are intact. No acute traumatic injury. No inflammatory change. SINUSES AND MASTOIDS: Bilateral paranasal sinuses, middle ears and mastoids are well aerated. SOFT TISSUES: 2.5 cm chin laceration. No soft tissue gas or discrete superficial soft tissue injury identified. No discrete hematoma  or fluid collection. Negative visible non-contrast larynx, pharynx, parapharyngeal spaces, retropharyngeal space, sublingual space, submandibular spaces, masticator and parotid spaces. BRAIN: Negative for age visible non-contrast brain parenchyma. CERVICAL SPINE: Cervical spine degeneration. IMPRESSION: 1. No acute traumatic injury identified in the face. No discrete superficial soft tissue injury by CT. Electronically signed by: Helayne Hurst MD 03/21/2024 10:16 AM EST RP Workstation: [id number]     Procedures  LACERATION  REPAIR Performed by: [facility] Authorized by: Benton Shone Consent: Verbal consent obtained. Risks and benefits: risks, benefits and alternatives were discussed Consent given by: patient Patient identity confirmed: provided demographic data Prepped and Draped in normal sterile fashion Wound explored  Laceration Location: chin/iinside of mouth  Laceration Length: 2cm/3cm  No Foreign Bodies seen or palpated  Anesthesia: local infiltration  Local anesthetic: lidocaine  2% with epinephrine  Anesthetic total: 4 ml  Irrigation method: syringe Amount of cleaning: standard  Skin closure: 7.0 prolene and 5.0 vicryl rapide  Number of sutures: 5/2  Technique: simple interrupted  Patient tolerance: Patient tolerated the procedure well with no immediate complications.  Medications Ordered in the ED  lidocaine -EPINEPHrine (XYLOCAINE  W/EPI) 2 %-1:200000 (PF) injection 20 mL (20 mLs Infiltration Given 03/21/24 1012)                                    Medical Decision Making Amount and/or Complexity of Data Reviewed Radiology: ordered and independent interpretation performed. Decision-making details documented in ED Course.  Risk Prescription drug management.   Patient presenting today after an injury from a metal spring to bar that caused a laceration to the inside of her mouth and chin.  Tetanus shot is up-to-date per patient.  Patient is having pain in the jaw area as well and will do a facial CT to ensure no fracture specially of the mandible.  Wound repaired as above. I have independently visualized and interpreted pt's images today.  CT face without fracture and radiology reports normal.  Findings discussed with the patient and she is stable for discharge home     Final diagnoses:  Facial laceration, initial encounter  Laceration of internal mouth, initial encounter    ED Discharge Orders     None          Shone Benton, MD 03/21/24 1041

## 2024-04-14 ENCOUNTER — Other Ambulatory Visit (HOSPITAL_COMMUNITY)

## 2024-04-22 ENCOUNTER — Other Ambulatory Visit: Payer: Self-pay | Admitting: Cardiology

## 2024-05-19 ENCOUNTER — Other Ambulatory Visit: Payer: Self-pay | Admitting: Cardiology

## 2024-05-22 ENCOUNTER — Other Ambulatory Visit: Payer: Self-pay | Admitting: Internal Medicine

## 2024-05-22 DIAGNOSIS — Z1231 Encounter for screening mammogram for malignant neoplasm of breast: Secondary | ICD-10-CM

## 2024-06-13 ENCOUNTER — Other Ambulatory Visit (HOSPITAL_COMMUNITY)
Admission: RE | Admit: 2024-06-13 | Discharge: 2024-06-13 | Disposition: A | Payer: Self-pay | Source: Ambulatory Visit | Attending: Medical Genetics | Admitting: Medical Genetics

## 2024-06-13 DIAGNOSIS — Z1231 Encounter for screening mammogram for malignant neoplasm of breast: Secondary | ICD-10-CM | POA: Insufficient documentation

## 2024-06-23 ENCOUNTER — Ambulatory Visit
